# Patient Record
Sex: Female | Born: 1990 | Race: White | Hispanic: No | Marital: Married | Smoking: Never smoker
Health system: Southern US, Community
[De-identification: ages and names within clinical notes are randomized; demographics above are authoritative.]

## PROBLEM LIST (undated history)

## (undated) ENCOUNTER — Inpatient Hospital Stay (HOSPITAL_COMMUNITY): Payer: Self-pay

## (undated) ENCOUNTER — Ambulatory Visit: Source: Home / Self Care

## (undated) DIAGNOSIS — A4902 Methicillin resistant Staphylococcus aureus infection, unspecified site: Secondary | ICD-10-CM

## (undated) DIAGNOSIS — K8020 Calculus of gallbladder without cholecystitis without obstruction: Secondary | ICD-10-CM

## (undated) DIAGNOSIS — R51 Headache: Secondary | ICD-10-CM

## (undated) DIAGNOSIS — O2301 Infections of kidney in pregnancy, first trimester: Secondary | ICD-10-CM

## (undated) DIAGNOSIS — B999 Unspecified infectious disease: Secondary | ICD-10-CM

## (undated) HISTORY — DX: Infections of kidney in pregnancy, first trimester: O23.01

## (undated) HISTORY — DX: Headache: R51

## (undated) HISTORY — PX: NO PAST SURGERIES: SHX2092

---

## 2011-03-22 ENCOUNTER — Encounter: Payer: Self-pay | Admitting: *Deleted

## 2011-03-22 ENCOUNTER — Emergency Department (HOSPITAL_COMMUNITY)
Admission: EM | Admit: 2011-03-22 | Discharge: 2011-03-22 | Disposition: A | Discharge status: Home / Self Care | Payer: Self-pay | Attending: Emergency Medicine | Admitting: Emergency Medicine

## 2011-03-22 DIAGNOSIS — J3489 Other specified disorders of nose and nasal sinuses: Secondary | ICD-10-CM | POA: Insufficient documentation

## 2011-03-22 DIAGNOSIS — Z09 Encounter for follow-up examination after completed treatment for conditions other than malignant neoplasm: Secondary | ICD-10-CM | POA: Insufficient documentation

## 2011-03-22 DIAGNOSIS — R221 Localized swelling, mass and lump, neck: Secondary | ICD-10-CM | POA: Insufficient documentation

## 2011-03-22 DIAGNOSIS — R220 Localized swelling, mass and lump, head: Secondary | ICD-10-CM | POA: Insufficient documentation

## 2011-03-22 MED ORDER — TETANUS-DIPHTH-ACELL PERTUSSIS 5-2.5-18.5 LF-MCG/0.5 IM SUSP
0.5000 mL | Freq: Once | INTRAMUSCULAR | Status: AC
  Administered 2011-03-22: 0.5 mL via INTRAMUSCULAR
  Filled 2011-03-22: qty 0.5

## 2011-03-22 NOTE — ED Notes (Signed)
No reaction to medication noted. 

## 2011-03-22 NOTE — ED Notes (Signed)
Pt has abcess to right nostril. Went to [facility] yesterday, started on antibiotics and pain meds with no relief yet.

## 2011-03-22 NOTE — ED Provider Notes (Signed)
History     CSN: [id number] Arrival date & time: 03/22/2011  4:25 PM   First MD Initiated Contact with Patient 03/22/11 1936    Chief complaint nasal pain  Chief Complaint  Patient presents with  . Wound Check    (Consider location/radiation/quality/duration/timing/severity/associated sxs/prior treatment) HPI Patient complains of painful swollen right side of her nose onset 4 days ago. Patient saw hospital in [place] yesterday prescribed Bactrim DS Bactroban ointment and Vicodin which she's taken without relief. He seems soft slight green drainage from the right nares today no other complaint no fever. Pain nonradiating sit moderate to severe worse with palpation. No other associated symptoms pain not improved by anything History reviewed. No pertinent past medical history.  History reviewed. No pertinent past surgical history.  History reviewed. No pertinent family history.  History  Substance Use Topics  . Smoking status: Never Smoker   . Smokeless tobacco: Not on file  . Alcohol Use: No    OB History    Grav Para Term Preterm Abortions TAB SAB Ect Mult Living                  Review of Systems  Constitutional: Negative.   HENT:       Nasal swell  Eyes: Negative.   Respiratory: Negative.   Skin:       Redness swelling of right nose  Neurological: Negative.   Hematological: Negative.   Psychiatric/Behavioral: Negative.     Allergies  Review of patient's allergies indicates no known allergies.  Home Medications   Current Outpatient Rx  Name Route Sig Dispense Refill  . HYDROCODONE-ACETAMINOPHEN 5-500 MG PO TABS Oral Take 1 tablet by mouth every 4 (four) hours as needed. For pain     . MUPIROCIN 2 % EX OINT Topical Apply 1 application topically 3 (three) times daily.      . SULFAMETHOXAZOLE-TMP DS 800-160 MG PO TABS Oral Take 1 tablet by mouth 2 (two) times daily.        BP 126/89  Pulse 80  Temp(Src) 98.6 F (37 C) (Oral)  Resp 16  SpO2 100%  LMP  03/10/2011  Physical Exam  Nursing note and vitals reviewed. Constitutional: She appears well-developed and well-nourished. No distress.  HENT:  Head: Normocephalic and atraumatic.  Right Ear: External ear normal.  Left Ear: External ear normal.  Mouth/Throat: Oropharynx is clear and moist.       Right nose reddened minimally tender nonfluctuant. No masses or abscess identified in nares  Eyes: Conjunctivae are normal. Pupils are equal, round, and reactive to light.       No pain on extraocular movement  Neck: Neck supple. No tracheal deviation present. No thyromegaly present.  Cardiovascular: Normal rate.   No murmur heard. Pulmonary/Chest: Effort normal.  Abdominal: Soft. Bowel sounds are normal. She exhibits no distension. There is no tenderness.  Musculoskeletal: Normal range of motion. She exhibits no edema and no tenderness.  Neurological: She is alert. Coordination normal.  Skin: Skin is warm and dry. No rash noted.  Psychiatric: She has a normal mood and affect.    ED Course  Procedures (including critical care time)  Labs Reviewed - No data to display No results found.   No diagnosis found.    MDM  No definite abscess seen Case discussed with Dr. Pollyann Kennedy And continue present medications. Dr. Pollyann Kennedy will see patient in office tomorrow Diagnosis pain in no Differential diagnosis cellulitis versus early abscess  Doug Sou, MD 03/22/11 2010

## 2011-11-06 ENCOUNTER — Encounter (HOSPITAL_COMMUNITY): Payer: Self-pay | Admitting: Emergency Medicine

## 2011-11-06 ENCOUNTER — Emergency Department (HOSPITAL_COMMUNITY)
Admission: EM | Admit: 2011-11-06 | Discharge: 2011-11-06 | Disposition: A | Discharge status: Home / Self Care | Payer: Self-pay | Attending: Emergency Medicine | Admitting: Emergency Medicine

## 2011-11-06 DIAGNOSIS — R6883 Chills (without fever): Secondary | ICD-10-CM | POA: Insufficient documentation

## 2011-11-06 DIAGNOSIS — R51 Headache: Secondary | ICD-10-CM | POA: Insufficient documentation

## 2011-11-06 DIAGNOSIS — R112 Nausea with vomiting, unspecified: Secondary | ICD-10-CM | POA: Insufficient documentation

## 2011-11-06 DIAGNOSIS — N390 Urinary tract infection, site not specified: Secondary | ICD-10-CM | POA: Insufficient documentation

## 2011-11-06 LAB — URINALYSIS, ROUTINE W REFLEX MICROSCOPIC
Bilirubin Urine: NEGATIVE
Glucose, UA: NEGATIVE mg/dL
Ketones, ur: NEGATIVE mg/dL
Protein, ur: 30 mg/dL — AB
pH: 6 (ref 5.0–8.0)

## 2011-11-06 LAB — URINE MICROSCOPIC-ADD ON

## 2011-11-06 LAB — CBC WITH DIFFERENTIAL/PLATELET
Eosinophils Absolute: 0.1 10*3/uL (ref 0.0–0.7)
HCT: 39.4 % (ref 36.0–46.0)
Hemoglobin: 13.1 g/dL (ref 12.0–15.0)
Lymphs Abs: 1.1 10*3/uL (ref 0.7–4.0)
MCH: 28.6 pg (ref 26.0–34.0)
MCHC: 33.2 g/dL (ref 30.0–36.0)
MCV: 86 fL (ref 78.0–100.0)
Monocytes Absolute: 0.2 10*3/uL (ref 0.1–1.0)
Monocytes Relative: 2 % — ABNORMAL LOW (ref 3–12)
Neutrophils Relative %: 88 % — ABNORMAL HIGH (ref 43–77)
RBC: 4.58 MIL/uL (ref 3.87–5.11)

## 2011-11-06 LAB — POCT I-STAT, CHEM 8
BUN: 12 mg/dL (ref 6–23)
Hemoglobin: 13.6 g/dL (ref 12.0–15.0)
Potassium: 3.5 mEq/L (ref 3.5–5.1)
Sodium: 142 mEq/L (ref 135–145)
TCO2: 23 mmol/L (ref 0–100)

## 2011-11-06 MED ORDER — ONDANSETRON HCL 4 MG/2ML IJ SOLN
4.0000 mg | Freq: Once | INTRAMUSCULAR | Status: AC
  Administered 2011-11-06: 4 mg via INTRAVENOUS
  Filled 2011-11-06: qty 2

## 2011-11-06 MED ORDER — SODIUM CHLORIDE 0.9 % IV BOLUS (SEPSIS)
1000.0000 mL | Freq: Once | INTRAVENOUS | Status: AC
  Administered 2011-11-06: 1000 mL via INTRAVENOUS

## 2011-11-06 MED ORDER — CEPHALEXIN 500 MG PO CAPS: 500.0000 mg | ORAL_CAPSULE | Freq: Four times a day (QID) | ORAL | Status: AC

## 2011-11-06 MED ORDER — CEPHALEXIN 250 MG PO CAPS
500.0000 mg | ORAL_CAPSULE | Freq: Once | ORAL | Status: AC
  Administered 2011-11-06: 500 mg via ORAL
  Filled 2011-11-06: qty 2

## 2011-11-06 MED ORDER — PROMETHAZINE HCL 25 MG PO TABS: 25.0000 mg | ORAL_TABLET | Freq: Four times a day (QID) | ORAL | Status: DC | PRN

## 2011-11-06 NOTE — ED Provider Notes (Signed)
Medical screening examination/treatment/procedure(s) were performed by non-physician practitioner and as supervising physician I was immediately available for consultation/collaboration.   Coreen Shippee, MD 11/06/11 0706 

## 2011-11-06 NOTE — ED Provider Notes (Signed)
History     CSN: [id number]  Arrival date & time 11/06/11  6578   First MD Initiated Contact with Patient 11/06/11 [phone]      Chief Complaint  Patient presents with  . Chills  . Headache    (Consider location/radiation/quality/duration/timing/severity/associated sxs/prior treatment) HPI Comments: Patient here after having awakened twice over the past 2 nights with shaking spells where she had to pile blankets on herself - states she did not feel cold, then she states she developed a vague frontal headache with several episodes of nausea and vomiting - states that she did the same thing last night but it resolved in about 2 hours - states felt fine through the day today - denies fever, chills, neck pain or stiffness, chest pain, shortness of breath, cough, congestion, abdominal pain, possibility of pregnancy, dysuria, hematuria, is currently on menses.  Patient is a 21 y.o. female presenting with headaches. The history is provided by the patient. No language interpreter was used.  Headache  This is a new problem. The current episode started yesterday. The problem occurs constantly. The problem has not changed since onset.The headache is associated with nothing. The pain is located in the frontal region. The quality of the pain is described as throbbing. The pain is at a severity of 6/10. The pain is moderate. The pain does not radiate. Associated symptoms include nausea and vomiting. Pertinent negatives include no anorexia, no fever, no malaise/fatigue, no chest pressure, no near-syncope, no orthopnea, no palpitations, no syncope and no shortness of breath. She has tried nothing for the symptoms. The treatment provided no relief.    History reviewed. No pertinent past medical history.  History reviewed. No pertinent past surgical history.  History reviewed. No pertinent family history.  History  Substance Use Topics  . Smoking status: Never Smoker   . Smokeless tobacco: Not on file  .  Alcohol Use: No    OB History    Grav Para Term Preterm Abortions TAB SAB Ect Mult Living                  Review of Systems  Constitutional: Negative for fever, chills and malaise/fatigue.  HENT: Negative for neck pain and neck stiffness.   Eyes: Negative for pain.  Respiratory: Negative for shortness of breath.   Cardiovascular: Negative for palpitations, orthopnea, syncope and near-syncope.  Gastrointestinal: Positive for nausea and vomiting. Negative for abdominal pain and anorexia.  Genitourinary: Negative for dysuria and flank pain.  Musculoskeletal: Negative for back pain.  Skin: Negative for rash.  Neurological: Positive for headaches.  All other systems reviewed and are negative.    Allergies  Review of patient's allergies indicates no known allergies.  Home Medications  No current outpatient prescriptions on file.  BP 101/69  Pulse 127  Temp 99.8 F (37.7 C) (Oral)  Resp 16  SpO2 98%  LMP 11/01/2011  Physical Exam  Nursing note and vitals reviewed. Constitutional: She is oriented to person, place, and time. She appears well-developed and well-nourished. No distress.  HENT:  Head: Normocephalic and atraumatic.  Right Ear: External ear normal.  Left Ear: External ear normal.  Nose: Nose normal.  Mouth/Throat: Oropharynx is clear and moist. No oropharyngeal exudate.  Eyes: Conjunctivae are normal. Pupils are equal, round, and reactive to light. No scleral icterus.  Neck: Normal range of motion. Neck supple. No Brudzinski's sign and no Kernig's sign noted.  Cardiovascular: Regular rhythm and normal heart sounds.  Exam reveals no gallop and no friction  rub.   No murmur heard.      tachycardia  Pulmonary/Chest: Effort normal and breath sounds normal. No respiratory distress. She has no wheezes. She has no rales. She exhibits no tenderness.  Abdominal: Soft. Bowel sounds are normal. She exhibits no distension and no mass. There is no tenderness. There is no  rebound and no guarding.  Musculoskeletal: Normal range of motion. She exhibits no edema and no tenderness.  Lymphadenopathy:    She has no cervical adenopathy.  Neurological: She is alert and oriented to person, place, and time. No cranial nerve deficit. She exhibits normal muscle tone. Coordination normal.  Skin: Skin is warm and dry. No rash noted. No erythema. No pallor.  Psychiatric: She has a normal mood and affect. Her behavior is normal. Judgment and thought content normal.    ED Course  Procedures (including critical care time)   Labs Reviewed  POCT PREGNANCY, URINE  URINALYSIS, ROUTINE W REFLEX MICROSCOPIC  CBC WITH DIFFERENTIAL  MONONUCLEOSIS SCREEN   No results found.  Results for orders placed during the hospital encounter of 11/06/11  URINALYSIS, ROUTINE W REFLEX MICROSCOPIC      Component Value Range   Color, Urine YELLOW  YELLOW   APPearance TURBID (*) CLEAR   Specific Gravity, Urine 1.017  1.005 - 1.030   pH 6.0  5.0 - 8.0   Glucose, UA NEGATIVE  NEGATIVE mg/dL   Hgb urine dipstick TRACE (*) NEGATIVE   Bilirubin Urine NEGATIVE  NEGATIVE   Ketones, ur NEGATIVE  NEGATIVE mg/dL   Protein, ur 30 (*) NEGATIVE mg/dL   Urobilinogen, UA 0.2  0.0 - 1.0 mg/dL   Nitrite POSITIVE (*) NEGATIVE   Leukocytes, UA LARGE (*) NEGATIVE  POCT PREGNANCY, URINE      Component Value Range   Preg Test, Ur NEGATIVE  NEGATIVE  URINE MICROSCOPIC-ADD ON      Component Value Range   Squamous Epithelial / LPF RARE  RARE   WBC, UA TOO NUMEROUS TO COUNT  <3 WBC/hpf   RBC / HPF 0-2  <3 RBC/hpf   Bacteria, UA MANY (*) RARE  POCT I-STAT, CHEM 8      Component Value Range   Sodium 142  135 - 145 mEq/L   Potassium 3.5  3.5 - 5.1 mEq/L   Chloride 105  96 - 112 mEq/L   BUN 12  6 - 23 mg/dL   Creatinine, Ser 4.09  0.50 - 1.10 mg/dL   Glucose, Bld 98  70 - 99 mg/dL   Calcium, Ion 8.11  9.14 - 1.32 mmol/L   TCO2 23  0 - 100 mmol/L   Hemoglobin 13.6  12.0 - 15.0 g/dL   HCT 78.2  95.6 -  21.3 %   No results found.   UTI    MDM  Patient here with 2 day history of chills, headache and vomiting but only at night - urine shows large leukocytes, positive nitrite with TNTC WBC's - this is the likely the cause of her symptoms - upon discussion with the patient she now states that she was having dysuria several days ago.        Izola Price Baker, [place] 11/06/11 [phone]

## 2011-11-06 NOTE — ED Notes (Signed)
Pt states that last night and tonight she started having chills. Pt states she couldn't stop shaking. Pt states that she vomited once and then started having generalized pain and decided to come in. Pt denies abdominal pain. Pt states slight HA. Pt vomited once in room.

## 2011-11-06 NOTE — Discharge Instructions (Signed)

## 2011-11-06 NOTE — ED Notes (Signed)
Patient complaining of chills, headache, nausea, and vomiting that started this morning.  Denies diarrhea.  Denies pain at this time.

## 2011-11-08 LAB — URINE CULTURE: Special Requests: NORMAL

## 2011-11-09 NOTE — ED Notes (Signed)
+   urine Patient treated with keflex-sensitive to same-chart appended per protocol MD. 

## 2012-05-08 NOTE — L&D Delivery Note (Signed)
Delivery Note At 7:02 PM a viable female was delivered via Vaginal, Spontaneous Delivery (Presentation: Middle Occiput Anterior).  APGAR: 9, 9; weight: pending Placenta status: Intact, Spontaneous.  Cord: 3 vessels with the following complications: None.   Anesthesia: Epidural  Episiotomy: None Lacerations: 1st degree;Perineal Suture Repair: 3.0 vicryl Est. Blood Loss (mL): 200  Mother pushed well for 30 mins to deliver viable female infant in direct OA position. No nuchal cord. Shoulders and body delivered without difficulty or delay. Baby vigorous and crying upon delivery and placed on maternal abdomen. Cord clamping delayed until umbilical cord stopped pulsing. Cord then clamped and cut by FOB.  Third stage of labor actively managed with gentle cord traction and Oxytocin. Placenta delivered intact with 3 vessel cord. Attention was then turned to the perineum which sustained a first degree laceration. The laceration was hemostatic however 1 stitch was placed to reapproximate the posterior fourchette. Pt tolerated the procedure well. Mother and infant stable at conclusion of delivery. Counts correct and verified. Mom to postpartum. Infant to couplet care/skin to skin.   Delivery performed by me with direct supervision by Caren Griffins, CNM  Cowart, Ryann 03/14/2013, 7:56 PM Evaluation and management procedures were performed by Resident physician under my supervision/collaboration. Chart reviewed, patient examined by me and I agree with management and plan.

## 2012-06-24 ENCOUNTER — Encounter (HOSPITAL_COMMUNITY): Payer: Self-pay | Admitting: *Deleted

## 2012-06-24 ENCOUNTER — Emergency Department (HOSPITAL_COMMUNITY)
Admission: EM | Admit: 2012-06-24 | Discharge: 2012-06-24 | Disposition: A | Discharge status: Home / Self Care | Payer: Self-pay | Attending: Emergency Medicine | Admitting: Emergency Medicine

## 2012-06-24 DIAGNOSIS — J3489 Other specified disorders of nose and nasal sinuses: Secondary | ICD-10-CM | POA: Insufficient documentation

## 2012-06-24 DIAGNOSIS — IMO0001 Reserved for inherently not codable concepts without codable children: Secondary | ICD-10-CM | POA: Insufficient documentation

## 2012-06-24 DIAGNOSIS — R05 Cough: Secondary | ICD-10-CM | POA: Insufficient documentation

## 2012-06-24 DIAGNOSIS — R059 Cough, unspecified: Secondary | ICD-10-CM | POA: Insufficient documentation

## 2012-06-24 DIAGNOSIS — R6889 Other general symptoms and signs: Secondary | ICD-10-CM

## 2012-06-24 DIAGNOSIS — R1110 Vomiting, unspecified: Secondary | ICD-10-CM | POA: Insufficient documentation

## 2012-06-24 DIAGNOSIS — R509 Fever, unspecified: Secondary | ICD-10-CM | POA: Insufficient documentation

## 2012-06-24 DIAGNOSIS — J029 Acute pharyngitis, unspecified: Secondary | ICD-10-CM | POA: Insufficient documentation

## 2012-06-24 MED ORDER — HYDROCODONE-HOMATROPINE 5-1.5 MG/5ML PO SYRP
5.0000 mL | ORAL_SOLUTION | Freq: Four times a day (QID) | ORAL | Status: DC | PRN
Start: 2012-06-24 — End: 2013-01-08

## 2012-06-24 MED ORDER — HYDROCOD POLST-CHLORPHEN POLST 10-8 MG/5ML PO LQCR
5.0000 mL | Freq: Once | ORAL | Status: AC
  Administered 2012-06-24: 5 mL via ORAL
  Filled 2012-06-24: qty 5

## 2012-06-24 NOTE — ED Notes (Signed)
Pt c/o cough beginning on Friday, fever starting Sat. Pt c/o vomiting x 1 Sat.

## 2012-06-24 NOTE — ED Provider Notes (Signed)
History     CSN: [id number]  Arrival date & time 06/24/12  0058   First MD Initiated Contact with Patient 06/24/12 0140      Chief Complaint  Patient presents with  . Fever    (Consider location/radiation/quality/duration/timing/severity/associated sxs/prior treatment) Patient is a 22 y.o. female presenting with fever. The history is provided by the patient.  Fever Max temp prior to arrival:  101 Temp source:  Oral Severity:  Mild Onset quality:  Gradual Duration:  4 days Timing:  Intermittent Chronicity:  New Relieved by: tylenol cold. Associated symptoms: congestion, cough, myalgias, rhinorrhea, sore throat and vomiting (post tussive x 1)   Associated symptoms: no chest pain, no chills, no confusion, no dysuria and no headaches   Risk factors: no hx of cancer, no immunosuppression, no recent travel and no sick contacts     History reviewed. No pertinent past medical history.  History reviewed. No pertinent past surgical history.  History reviewed. No pertinent family history.  History  Substance Use Topics  . Smoking status: Never Smoker   . Smokeless tobacco: Not on file  . Alcohol Use: No    OB History   Grav Para Term Preterm Abortions TAB SAB Ect Mult Living                  Review of Systems  Constitutional: Positive for fever. Negative for chills and appetite change.  HENT: Positive for congestion, sore throat and rhinorrhea.   Eyes: Negative for visual disturbance.  Respiratory: Positive for cough. Negative for shortness of breath.   Cardiovascular: Negative for chest pain and leg swelling.  Gastrointestinal: Positive for vomiting (post tussive x 1). Negative for abdominal pain.  Genitourinary: Negative for dysuria, urgency and frequency.  Musculoskeletal: Positive for myalgias.  Neurological: Negative for dizziness, syncope, weakness, light-headedness, numbness and headaches.  Psychiatric/Behavioral: Negative for confusion.  All other systems  reviewed and are negative.    Allergies  Review of patient's allergies indicates no known allergies.  Home Medications   Current Outpatient Rx  Name  Route  Sig  Dispense  Refill  . EXPIRED: promethazine (PHENERGAN) 25 MG tablet   Oral   Take 1 tablet (25 mg total) by mouth every 6 (six) hours as needed for nausea.   30 tablet   0     BP 120/80  Pulse 124  Temp(Src) 99.6 F (37.6 C) (Oral)  Resp 18  Ht 5\' 3"  (1.6 m)  Wt 201 lb (91.173 kg)  BMI 35.61 kg/m2  SpO2 97%  LMP 06/04/2012  Physical Exam  Nursing note and vitals reviewed. Constitutional: She is oriented to person, place, and time. She appears well-developed and well-nourished. No distress.  HENT:  Head: Normocephalic and atraumatic.  Nasal congestion & rhinorrhea. Normal L & R external ear canals and TMs. No tragal or mastoid tenderness. Oropharynx moist, without tonsillar exudate.   Eyes:  No pain w EOM. Conjunctiva normal   Neck: Normal range of motion.  Soft, no nuchal rigidity. No lymphadenopathy  Cardiovascular:  RRR, no aberrancy on auscultation  Pulmonary/Chest: Effort normal.  LCAB, no respiratory distress  Musculoskeletal: Normal range of motion.  Neurological: She is alert and oriented to person, place, and time.  Skin: Skin is warm and dry. She is not diaphoretic.  No rash  Psychiatric: She has a normal mood and affect. Her behavior is normal.    ED Course  Procedures (including critical care time)  Labs Reviewed  URINALYSIS, ROUTINE W REFLEX MICROSCOPIC  No results found.   No diagnosis found.    MDM  Flu like s/s Patient with symptoms consistent with influenza.  Vitals are stable, low-grade fever.  No signs of dehydration, tolerating PO's.  Lungs are clear. Due to patient's presentation and physical exam a chest x-ray was not ordered bc likely diagnosis of flu.  Discussed the cost versus benefit of Tamiflu treatment with the patient.  The patient understands that symptoms are  greater than the recommended 24-48 hour window of treatment.  Patient will be discharged with instructions to orally hydrate, rest, and use over-the-counter medications such as anti-inflammatories ibuprofen and Aleve for muscle aches and Tylenol for fever.  Patient will also be given a cough suppressant.          Jaci Carrel, [place] 06/24/12 0206

## 2012-06-24 NOTE — ED Provider Notes (Signed)
Medical screening examination/treatment/procedure(s) were performed by non-physician practitioner and as supervising physician I was immediately available for consultation/collaboration.   Lyanne Co, MD 06/24/12 [phone]

## 2012-06-24 NOTE — ED Notes (Signed)
Pt verbalized understanding not to drive or work while taking narcotic cough medication. Ambulatory to d/c window.

## 2012-07-27 ENCOUNTER — Encounter (HOSPITAL_COMMUNITY): Payer: Self-pay | Admitting: Emergency Medicine

## 2012-07-27 ENCOUNTER — Emergency Department (HOSPITAL_COMMUNITY)
Admission: EM | Admit: 2012-07-27 | Discharge: 2012-07-27 | Disposition: A | Discharge status: Home / Self Care | Payer: Medicaid Other | Attending: Emergency Medicine | Admitting: Emergency Medicine

## 2012-07-27 DIAGNOSIS — N6459 Other signs and symptoms in breast: Secondary | ICD-10-CM | POA: Insufficient documentation

## 2012-07-27 DIAGNOSIS — R109 Unspecified abdominal pain: Secondary | ICD-10-CM | POA: Insufficient documentation

## 2012-07-27 DIAGNOSIS — Z3490 Encounter for supervision of normal pregnancy, unspecified, unspecified trimester: Secondary | ICD-10-CM

## 2012-07-27 DIAGNOSIS — O219 Vomiting of pregnancy, unspecified: Secondary | ICD-10-CM | POA: Insufficient documentation

## 2012-07-27 DIAGNOSIS — O9989 Other specified diseases and conditions complicating pregnancy, childbirth and the puerperium: Secondary | ICD-10-CM | POA: Insufficient documentation

## 2012-07-27 LAB — URINALYSIS, ROUTINE W REFLEX MICROSCOPIC
Glucose, UA: NEGATIVE mg/dL
Hgb urine dipstick: NEGATIVE
Specific Gravity, Urine: 1.009 (ref 1.005–1.030)

## 2012-07-27 LAB — PREGNANCY, URINE: Preg Test, Ur: POSITIVE — AB

## 2012-07-27 LAB — POCT PREGNANCY, URINE: Preg Test, Ur: POSITIVE — AB

## 2012-07-27 NOTE — ED Notes (Signed)
Pt has taken 3 pregnancy tests at home with pos results. Pt wants confirmation she is pregnant.

## 2012-07-27 NOTE — ED Provider Notes (Signed)
Medical screening examination/treatment/procedure(s) were performed by non-physician practitioner and as supervising physician I was immediately available for consultation/collaboration.   Jaylon Grode B. Kean Gautreau, MD 07/27/12 1550 

## 2012-07-27 NOTE — ED Provider Notes (Signed)
History     CSN: [id number]  Arrival date & time 07/27/12  0021   First MD Initiated Contact with Patient 07/27/12 0113      Chief Complaint  Patient presents with  . Possible Pregnancy    (Consider location/radiation/quality/duration/timing/severity/associated sxs/prior treatment) HPI  12:36 AM 22 year old G0 P0 female presents requesting for pregnancy test. Patient states she has been trying to get pregnant with her husband and recently has had positive pregnancy tests. She has had 3 separate positive pregnancy test, but would like to "make sure i'm pregnant".. Her last menstrual period was January 28. She denies any other significant complaint except some sensation of breast heaviness, tenderness at the nipples, and mild low abdominal cramping with nausea and vomiting. She denies fever, chills, chest pain, shortness of breath, dysuria, hematuria, or rash.  History reviewed. No pertinent past medical history.  History reviewed. No pertinent past surgical history.  No family history on file.  History  Substance Use Topics  . Smoking status: Never Smoker   . Smokeless tobacco: Not on file  . Alcohol Use: No    OB History   Grav Para Term Preterm Abortions TAB SAB Ect Mult Living                  Review of Systems  Constitutional:       10 Systems reviewed and all are negative for acute change except as noted in the HPI.     Allergies  Review of patient's allergies indicates no known allergies.  Home Medications   Current Outpatient Rx  Name  Route  Sig  Dispense  Refill  . HYDROcodone-homatropine (HYCODAN) 5-1.5 MG/5ML syrup   Oral   Take 5 mLs by mouth every 6 (six) hours as needed for cough.   120 mL   0   . ibuprofen (ADVIL,MOTRIN) 200 MG tablet   Oral   Take 400 mg by mouth every 6 (six) hours as needed for pain or fever.           BP 117/55  Pulse 76  Temp(Src) 98.2 F (36.8 C) (Oral)  Resp 18  SpO2 100%  LMP 06/04/2012  Physical Exam   Nursing note and vitals reviewed. Constitutional: She appears well-developed and well-nourished. No distress.  Awake, alert, nontoxic appearance  HENT:  Head: Atraumatic.  Eyes: Conjunctivae are normal. Right eye exhibits no discharge. Left eye exhibits no discharge.  Neck: Neck supple.  Cardiovascular: Normal rate and regular rhythm.   Pulmonary/Chest: Effort normal. No respiratory distress. She exhibits no tenderness.  Abdominal: Soft. There is no tenderness. There is no rebound.  Musculoskeletal: She exhibits no tenderness.  ROM appears intact, no obvious focal weakness  Neurological: She is alert.  Skin: No rash noted.  Psychiatric: She has a normal mood and affect.    ED Course  Procedures (including critical care time)  1:37 AM Pt is pregnant.  Will give referral to OBGYN for further care.  Encourage prenatal vitamin.    Labs Reviewed  POCT PREGNANCY, URINE - Abnormal; Notable for the following:    Preg Test, Ur POSITIVE (*)    All other components within normal limits  URINALYSIS, ROUTINE W REFLEX MICROSCOPIC  PREGNANCY, URINE   No results found.   1. Pregnant       MDM  BP 117/55  Pulse 76  Temp(Src) 98.2 F (36.8 C) (Oral)  Resp 18  SpO2 100%  LMP 06/04/2012         Fayrene Helper,  PA-C 07/27/12 0139

## 2012-09-06 LAB — OB RESULTS CONSOLE HIV ANTIBODY (ROUTINE TESTING): HIV: NONREACTIVE

## 2012-09-06 LAB — OB RESULTS CONSOLE ABO/RH: RH Type: POSITIVE

## 2012-09-06 LAB — OB RESULTS CONSOLE ANTIBODY SCREEN: Antibody Screen: NEGATIVE

## 2012-09-06 LAB — OB RESULTS CONSOLE GC/CHLAMYDIA: Gonorrhea: NEGATIVE

## 2012-09-06 LAB — OB RESULTS CONSOLE TSH: TSH: 3.289

## 2012-09-06 LAB — OB RESULTS CONSOLE RUBELLA ANTIBODY, IGM: Rubella: NON-IMMUNE/NOT IMMUNE

## 2012-10-02 LAB — CYTOLOGY - PAP: Pap Smear: NORMAL

## 2013-01-08 ENCOUNTER — Encounter (HOSPITAL_COMMUNITY): Payer: Self-pay | Admitting: Emergency Medicine

## 2013-01-08 ENCOUNTER — Encounter: Payer: Self-pay | Admitting: *Deleted

## 2013-01-08 ENCOUNTER — Emergency Department (HOSPITAL_COMMUNITY)
Admission: EM | Admit: 2013-01-08 | Discharge: 2013-01-08 | Disposition: A | Discharge status: Home / Self Care | Payer: Medicaid Other | Attending: Emergency Medicine | Admitting: Emergency Medicine

## 2013-01-08 DIAGNOSIS — Z8659 Personal history of other mental and behavioral disorders: Secondary | ICD-10-CM | POA: Insufficient documentation

## 2013-01-08 DIAGNOSIS — Z79899 Other long term (current) drug therapy: Secondary | ICD-10-CM | POA: Insufficient documentation

## 2013-01-08 DIAGNOSIS — R1110 Vomiting, unspecified: Secondary | ICD-10-CM

## 2013-01-08 DIAGNOSIS — O210 Mild hyperemesis gravidarum: Secondary | ICD-10-CM | POA: Insufficient documentation

## 2013-01-08 LAB — URINALYSIS, ROUTINE W REFLEX MICROSCOPIC
Bilirubin Urine: NEGATIVE
Glucose, UA: NEGATIVE mg/dL
Hgb urine dipstick: NEGATIVE
Ketones, ur: 15 mg/dL — AB
Nitrite: NEGATIVE
Protein, ur: NEGATIVE mg/dL
Specific Gravity, Urine: 1.019 (ref 1.005–1.030)
Urobilinogen, UA: 1 mg/dL (ref 0.0–1.0)
pH: 6 (ref 5.0–8.0)

## 2013-01-08 LAB — BASIC METABOLIC PANEL
BUN: 7 mg/dL (ref 6–23)
CO2: 23 mEq/L (ref 19–32)
Calcium: 9 mg/dL (ref 8.4–10.5)
Chloride: 102 mEq/L (ref 96–112)
Creatinine, Ser: 0.49 mg/dL — ABNORMAL LOW (ref 0.50–1.10)
GFR calc Af Amer: >90 mL/min (ref >90)
GFR calc non Af Amer: >90 mL/min (ref >90)
Glucose, Bld: 88 mg/dL (ref 70–99)
Potassium: 3.8 mEq/L (ref 3.5–5.1)
Sodium: 134 mEq/L — ABNORMAL LOW (ref 135–145)

## 2013-01-08 LAB — CBC WITH DIFFERENTIAL/PLATELET
Basophils Absolute: 0 10*3/uL (ref 0.0–0.1)
Basophils Relative: 0 % (ref 0–1)
Eosinophils Absolute: 0.1 10*3/uL (ref 0.0–0.7)
Eosinophils Relative: 1 % (ref 0–5)
HCT: 32.2 % — ABNORMAL LOW (ref 36.0–46.0)
Hemoglobin: 10.9 g/dL — ABNORMAL LOW (ref 12.0–15.0)
Lymphocytes Relative: 16 % (ref 12–46)
Lymphs Abs: 1.2 10*3/uL (ref 0.7–4.0)
MCH: 29.1 pg (ref 26.0–34.0)
MCHC: 33.9 g/dL (ref 30.0–36.0)
MCV: 86.1 fL (ref 78.0–100.0)
Monocytes Absolute: 0.6 10*3/uL (ref 0.1–1.0)
Monocytes Relative: 8 % (ref 3–12)
Neutro Abs: 5.6 10*3/uL (ref 1.7–7.7)
Neutrophils Relative %: 76 % (ref 43–77)
Platelets: 318 10*3/uL (ref 150–400)
RBC: 3.74 MIL/uL — ABNORMAL LOW (ref 3.87–5.11)
RDW: 13.3 % (ref 11.5–15.5)
WBC: 7.3 10*3/uL (ref 4.0–10.5)

## 2013-01-08 LAB — URINE MICROSCOPIC-ADD ON

## 2013-01-08 MED ORDER — ONDANSETRON HCL 4 MG PO TABS: 4.0000 mg | ORAL_TABLET | Freq: Four times a day (QID) | ORAL | Status: DC

## 2013-01-08 MED ORDER — SODIUM CHLORIDE 0.9 % IV BOLUS (SEPSIS)
1000.0000 mL | Freq: Once | INTRAVENOUS | Status: AC
  Administered 2013-01-08: 1000 mL via INTRAVENOUS

## 2013-01-08 NOTE — Progress Notes (Signed)
At bedside, G1P0 at 31wk 1day. Pt reports n/v for more than 24 hours and unable to keep anything down. Pt states positive fetal movement, denies contractions, pain, leakage of fluid, or vaginal bleeding. Pt states she has received [facility] in [place] but has switched to [facility] and has first appointment on 9/15 in the clinic.

## 2013-01-08 NOTE — Progress Notes (Signed)
[facility] called regarding pt at 31 weeks with n/v. OB RR RN in route

## 2013-01-08 NOTE — ED Notes (Signed)
Pt c/o of vomiting since yesterday, unable to keep anything down. [redacted] weeks pregnant.

## 2013-01-08 NOTE — ED Notes (Signed)
As I write this, the [facility] nurse is evaluating her; and has her on the fetal monitor.  Pt. Tells me she bagan to have frequent episodes of n/v since yesterday.  She denies cough/fever/diarrhea and is in no distress.  She happily tells me she is [redacted] week gestation with E.D.C. Of Nov. 4, 2014.

## 2013-01-09 ENCOUNTER — Inpatient Hospital Stay (HOSPITAL_COMMUNITY)
Admission: AD | Admit: 2013-01-09 | Payer: Medicaid Other | Source: Ambulatory Visit | Admitting: Obstetrics & Gynecology

## 2013-01-14 NOTE — ED Provider Notes (Signed)
CSN: [id number]     Arrival date & time 01/08/13  1808 History   First MD Initiated Contact with Patient 01/08/13 1837     Chief Complaint  Patient presents with  . Emesis During Pregnancy   (Consider location/radiation/quality/duration/timing/severity/associated sxs/prior Treatment) HPI  22yF with n/v. Onset about a days ago. Multiple episodes. Denies any pain. ~31w pregnant. Some issues early in pregnancy with nausea/vomting, but none in several weeks. No bleeding/leakage of fluid. No fever or chills. No urinary complaints. First pregnancy. No major issues thus far. Has been getting prenatal care.   Past Medical History  Diagnosis Date  . Anxiety   . EXBMWUXL(244.0)    Past Surgical History  Procedure Laterality Date  . Tonsillectomy    . Appendectomy     Family History  Problem Relation Age of Onset  . Asthma Brother   . Restless legs syndrome Mother   . Restless legs syndrome Sister    History  Substance Use Topics  . Smoking status: Never Smoker   . Smokeless tobacco: Not on file  . Alcohol Use: No   OB History   Grav Para Term Preterm Abortions TAB SAB Ect Mult Living   1              Review of Systems  All systems reviewed and negative, other than as noted in HPI.   Allergies  Review of patient's allergies indicates no known allergies.  Home Medications   Current Outpatient Rx  Name  Route  Sig  Dispense  Refill  . Prenatal Vit-Fe Fumarate-FA (PRENATAL MULTIVITAMIN) TABS tablet   Oral   Take 1 tablet by mouth daily at 12 noon.         . ranitidine (ZANTAC) 150 MG tablet   Oral   Take 150 mg by mouth 2 (two) times daily.         . ondansetron (ZOFRAN) 4 MG tablet   Oral   Take 1 tablet (4 mg total) by mouth every 6 (six) hours.   12 tablet   0    BP 107/59  Pulse 97  Temp(Src) 99.3 F (37.4 C) (Oral)  Resp 18  Ht 5\' 3"  (1.6 m)  Wt 210 lb (95.255 kg)  BMI 37.21 kg/m2  SpO2 97%  LMP 06/04/2012 Physical Exam  Nursing note and vitals  reviewed. Constitutional: She appears well-developed and well-nourished. No distress.  HENT:  Head: Normocephalic and atraumatic.  Eyes: Conjunctivae are normal. Right eye exhibits no discharge. Left eye exhibits no discharge.  Neck: Neck supple.  Cardiovascular: Normal rate, regular rhythm and normal heart sounds.  Exam reveals no gallop and no friction rub.   No murmur heard. Pulmonary/Chest: Effort normal and breath sounds normal. No respiratory distress.  Abdominal: Soft. She exhibits no distension. There is no tenderness.  Gravid uterus palpable above umbilicus.   Musculoskeletal: She exhibits no edema and no tenderness.  Neurological: She is alert.  Skin: Skin is warm and dry.  Psychiatric: She has a normal mood and affect. Her behavior is normal. Thought content normal.    ED Course  Procedures (including critical care time) Labs Review Labs Reviewed  URINALYSIS, ROUTINE W REFLEX MICROSCOPIC - Abnormal; Notable for the following:    APPearance CLOUDY (*)    Ketones, ur 15 (*)    Leukocytes, UA TRACE (*)    All other components within normal limits  CBC WITH DIFFERENTIAL - Abnormal; Notable for the following:    RBC 3.74 (*)  Hemoglobin 10.9 (*)    HCT 32.2 (*)    All other components within normal limits  BASIC METABOLIC PANEL - Abnormal; Notable for the following:    Sodium 134 (*)    Creatinine, Ser 0.49 (*)    All other components within normal limits  URINE MICROSCOPIC-ADD ON - Abnormal; Notable for the following:    Squamous Epithelial / LPF MANY (*)    Bacteria, UA FEW (*)    All other components within normal limits   Imaging Review No results found.  MDM   1. Emesis    22yF with vomiting. No abdominal pain and nontender abdomen. Baby lokos good on monitor.  Labs reassuring. Few bacteria noted on UA but many squam cells. abx deferred. Continues symtpomatic tx. Return precautions discussed.     Raeford Razor, MD 01/14/13 [phone]

## 2013-01-20 ENCOUNTER — Ambulatory Visit (INDEPENDENT_AMBULATORY_CARE_PROVIDER_SITE_OTHER): Payer: Medicaid Other | Admitting: Obstetrics & Gynecology

## 2013-01-20 ENCOUNTER — Encounter: Payer: Self-pay | Admitting: Obstetrics & Gynecology

## 2013-01-20 VITALS — BP 125/86 | Temp 98.6°F | Wt 211.6 lb

## 2013-01-20 DIAGNOSIS — Z3403 Encounter for supervision of normal first pregnancy, third trimester: Secondary | ICD-10-CM

## 2013-01-20 DIAGNOSIS — O99340 Other mental disorders complicating pregnancy, unspecified trimester: Secondary | ICD-10-CM

## 2013-01-20 DIAGNOSIS — Z23 Encounter for immunization: Secondary | ICD-10-CM

## 2013-01-20 DIAGNOSIS — Z3400 Encounter for supervision of normal first pregnancy, unspecified trimester: Secondary | ICD-10-CM | POA: Insufficient documentation

## 2013-01-20 LAB — POCT URINALYSIS DIP (DEVICE)
Bilirubin Urine: NEGATIVE
Glucose, UA: NEGATIVE mg/dL
Nitrite: NEGATIVE

## 2013-01-20 MED ORDER — TETANUS-DIPHTH-ACELL PERTUSSIS 5-2.5-18.5 LF-MCG/0.5 IM SUSP
0.5000 mL | Freq: Once | INTRAMUSCULAR | Status: AC
  Administered 2013-01-20: 0.5 mL via INTRAMUSCULAR

## 2013-01-20 NOTE — Progress Notes (Signed)
Pulse- 91 New ob packet given Tdap vaccine consented and info given Weight gain of 11-20lb

## 2013-01-20 NOTE — Progress Notes (Signed)
Pt received prenatal care at Dr. Tawni Levy office.  Parental records are confusing to interpret.  Many pertinent negative listed.  Pt only complains of mild headaches occasionally.  Pt did refuse genetic testing.  Pt reports nml anatomy US.  I do not have rocrds of the [place].  Will ask Dr. Shawnie Pons to fax those over.

## 2013-01-20 NOTE — Patient Instructions (Signed)
Intrauterine Device Information  An intrauterine device (IUD) is inserted into your uterus and prevents pregnancy. There are 2 types of IUDs available:  · Copper IUD. This type of IUD is wrapped in copper wire and is placed inside the uterus. Copper makes the uterus and fallopian tubes produce a fluid that kills sperm. The copper IUD can stay in place for 10 years.  · Hormone IUD. This type of IUD contains the hormone progestin (synthetic progesterone). The hormone thickens the cervical mucus and prevents sperm from entering the uterus, and it also thins the uterine lining to prevent implantation of a fertilized egg. The hormone can weaken or kill the sperm that get into the uterus. The hormone IUD can stay in place for 5 years.  Your caregiver will make sure you are a good candidate for a contraceptive IUD. Discuss with your caregiver the possible side effects.  ADVANTAGES  · It is highly effective, reversible, long-acting, and low maintenance.  · There are no estrogen-related side effects.  · An IUD can be used when breastfeeding.  · It is not associated with weight gain.  · It works immediately after insertion.  · The copper IUD does not interfere with your female hormones.  · The progesterone IUD can make heavy menstrual periods lighter.  · The progesterone IUD can be used for 5 years.  · The copper IUD can be used for 10 years.  DISADVANTAGES  · The progesterone IUD can be associated with irregular bleeding patterns.  · The copper IUD can make your menstrual flow heavier and more painful.  · You may experience cramping and vaginal bleeding after insertion.  Document Released: 03/28/2004 Document Revised: 07/17/2011 Document Reviewed: 08/27/2010  [employer]® Patient Information ©2014 [employer].

## 2013-01-21 DIAGNOSIS — O99340 Other mental disorders complicating pregnancy, unspecified trimester: Secondary | ICD-10-CM

## 2013-01-22 ENCOUNTER — Encounter: Payer: Self-pay | Admitting: *Deleted

## 2013-01-27 ENCOUNTER — Encounter: Payer: Self-pay | Admitting: *Deleted

## 2013-01-27 DIAGNOSIS — O99340 Other mental disorders complicating pregnancy, unspecified trimester: Secondary | ICD-10-CM | POA: Insufficient documentation

## 2013-02-03 ENCOUNTER — Ambulatory Visit (INDEPENDENT_AMBULATORY_CARE_PROVIDER_SITE_OTHER): Payer: Medicaid Other | Admitting: Advanced Practice Midwife

## 2013-02-03 VITALS — BP 112/80 | Temp 99.1°F | Wt 216.9 lb

## 2013-02-03 DIAGNOSIS — O99340 Other mental disorders complicating pregnancy, unspecified trimester: Secondary | ICD-10-CM

## 2013-02-03 LAB — POCT URINALYSIS DIP (DEVICE)
Bilirubin Urine: NEGATIVE
Ketones, ur: NEGATIVE mg/dL
Protein, ur: NEGATIVE mg/dL

## 2013-02-03 NOTE — Progress Notes (Signed)
P = 94 

## 2013-02-03 NOTE — Progress Notes (Signed)
Doing well.  Good fetal movement, denies vaginal bleeding, LOF, regular contractions.  

## 2013-02-18 ENCOUNTER — Ambulatory Visit (INDEPENDENT_AMBULATORY_CARE_PROVIDER_SITE_OTHER): Payer: Medicaid Other | Admitting: Obstetrics & Gynecology

## 2013-02-18 ENCOUNTER — Encounter: Payer: Self-pay | Admitting: Obstetrics & Gynecology

## 2013-02-18 VITALS — BP 121/84 | Temp 98.6°F | Wt 217.5 lb

## 2013-02-18 DIAGNOSIS — O99340 Other mental disorders complicating pregnancy, unspecified trimester: Secondary | ICD-10-CM

## 2013-02-18 DIAGNOSIS — Z3403 Encounter for supervision of normal first pregnancy, third trimester: Secondary | ICD-10-CM

## 2013-02-18 LAB — OB RESULTS CONSOLE GC/CHLAMYDIA: Chlamydia: NEGATIVE

## 2013-02-18 NOTE — Progress Notes (Signed)
Routine visit. Good FM. Cervical cultures obtained. She declines a flu vaccine despite recommendations.

## 2013-02-18 NOTE — Progress Notes (Signed)
P=83 Pt reports some irregular cramping. Decline Flu vaccine.

## 2013-02-19 LAB — POCT URINALYSIS DIP (DEVICE)
Glucose, UA: NEGATIVE mg/dL
Hgb urine dipstick: NEGATIVE
Nitrite: NEGATIVE
Urobilinogen, UA: 0.2 mg/dL (ref 0.0–1.0)

## 2013-02-19 LAB — GC/CHLAMYDIA PROBE AMP: CT Probe RNA: NEGATIVE

## 2013-02-25 ENCOUNTER — Ambulatory Visit (INDEPENDENT_AMBULATORY_CARE_PROVIDER_SITE_OTHER): Payer: Medicaid Other | Admitting: Family Medicine

## 2013-02-25 ENCOUNTER — Encounter: Payer: Self-pay | Admitting: Family Medicine

## 2013-02-25 VITALS — BP 127/85 | Wt 219.0 lb

## 2013-02-25 DIAGNOSIS — O99340 Other mental disorders complicating pregnancy, unspecified trimester: Secondary | ICD-10-CM

## 2013-02-25 DIAGNOSIS — Z3403 Encounter for supervision of normal first pregnancy, third trimester: Secondary | ICD-10-CM

## 2013-02-25 LAB — POCT URINALYSIS DIP (DEVICE)
Bilirubin Urine: NEGATIVE
Nitrite: NEGATIVE
Protein, ur: NEGATIVE mg/dL
pH: 6.5 (ref 5.0–8.0)

## 2013-02-25 NOTE — Progress Notes (Signed)
  Subjective:    Madeline [name] is a 22 y.o. female being seen today for her obstetrical visit. She is at [redacted]w[redacted]d gestation. Patient reports no bleeding, no contractions, no cramping and no leaking. Fetal movement: normal.  Menstrual History: OB History   Grav Para Term Preterm Abortions TAB SAB Ect Mult Living   1                Patient's last menstrual period was 06/04/2012.    The following portions of the patient's history were reviewed and updated as appropriate: allergies, current medications, past family history, past medical history, past social history, past surgical history and problem list.  Review of Systems Pertinent items are noted in HPI.   Objective:    BP 127/85  Wt 99.338 kg (219 lb)  BMI 38.8 kg/m2  LMP 06/04/2012  Breastfeeding? Unknown FHT: 140 BPM  Uterine Size: 38 cm  Presentations: cephalic  Pelvic Exam:              Dilation: 1cm       Effacement: 25%             Station:  -3    Consistency: soft            Position: posterior     Assessment:   Madeline [name] is a 22 y.o. G1P0 at [redacted]w[redacted]d here for ROB visit.    Discussed with Patient:  - Plans to breast/bottle feed.  All questions answered. - Continue prenatal vitamins. - Reviewed fetal kick counts Pt to perform daily at a time when the baby is active, lie laterally with both hands on belly in quiet room and count all movements (hiccups, shoulder rolls, obvious kicks, etc); pt is to report to clinic MAU for less than 10 movements felt in a 2 hour time period-pt told as soon as she counts 10 movements the count is complete.  - Routine precautions discussed (depression, infection s/s).   Patient provided with all pertinent phone numbers for emergencies. - RTC for any VB, regular, painful cramps/ctxs occurring at a rate of >2/10 min, fever (100.5 or higher), n/v/d, any pain that is unresolving or worsening, LOF, decreased fetal movement, CP, SOB, edema -RTC in one week for next  visit.  Problems: Patient Active Problem List   Diagnosis Date Noted  . Supervision of low-risk first pregnancy 01/20/2013    To Do: 1. NTD  [ ]  Vaccines: Flu:  Tdap: Recd [ ]  BCM: condoms [ ]  Readiness: baby has a place to sleep, car seat, other baby necessities.  Edu: [x ] TL precautions; [ ]  BF class; [ ]  childbirth class; [ ]   BF counseling;

## 2013-02-25 NOTE — Progress Notes (Signed)
P = 88   Pt reports some sharp, shooting pain into vagina, pelvic pressure and back pain/pressure. Pt desires cx exam.

## 2013-03-05 ENCOUNTER — Ambulatory Visit (INDEPENDENT_AMBULATORY_CARE_PROVIDER_SITE_OTHER): Payer: Medicaid Other | Admitting: Family Medicine

## 2013-03-05 ENCOUNTER — Encounter: Payer: Self-pay | Admitting: Family Medicine

## 2013-03-05 VITALS — BP 123/86 | Temp 98.0°F | Wt 222.3 lb

## 2013-03-05 DIAGNOSIS — Z3403 Encounter for supervision of normal first pregnancy, third trimester: Secondary | ICD-10-CM

## 2013-03-05 DIAGNOSIS — Z3400 Encounter for supervision of normal first pregnancy, unspecified trimester: Secondary | ICD-10-CM

## 2013-03-05 LAB — POCT URINALYSIS DIP (DEVICE)
Hgb urine dipstick: NEGATIVE
Protein, ur: NEGATIVE mg/dL
Specific Gravity, Urine: 1.02 (ref 1.005–1.030)
Urobilinogen, UA: 0.2 mg/dL (ref 0.0–1.0)
pH: 7 (ref 5.0–8.0)

## 2013-03-05 NOTE — Progress Notes (Signed)
P=83,   C/o pelvic pressure and sharp pelvic pains. States had a lot of mucous discharge after last visit, and also a lot Sunday night after intercourse. Looks like "foam soap"

## 2013-03-05 NOTE — Patient Instructions (Signed)

## 2013-03-05 NOTE — Progress Notes (Signed)
Madeline [name] is a 22 y.o. G1P0 at [redacted]w[redacted]d by L=13 here for ROB visit. Negative (10/14 0000)  +FM, no lof, no vb, intermittent ctx  Stripped membranes today  1.5/30%/-3  Discussed with Patient:  - Plans to breast feed.  All questions answered. - Continue prenatal vitamins. - Reviewed fetal kick counts Pt to perform daily at a time when the baby is active, lie laterally with both hands on belly in quiet room and count all movements (hiccups, shoulder rolls, obvious kicks, etc); pt is to report to clinic MAU for less than 10 movements felt in a 2 hour time period-pt told as soon as she counts 10 movements the count is complete.  - Routine precautions discussed (depression, infection s/s).   Patient provided with all pertinent phone numbers for emergencies. - RTC for any VB, regular, painful cramps/ctxs occurring at a rate of >2/10 min, fever (100.5 or higher), n/v/d, any pain that is unresolving or worsening, LOF, decreased fetal movement, CP, SOB, edema -RTC in one week for next visit.  Problems: Patient Active Problem List   Diagnosis Date Noted  . Supervision of low-risk first pregnancy 01/20/2013    To Do: 1. Schedule induction next visit if not delviered  [ ]  Vaccines: [id number]  Tdap: recd [ ]  BCM: condoms [ ]  Readiness: baby has a place to sleep, car seat, other baby necessities.  Edu: [x ] TL precautions; [ ]  BF class; [ ]  childbirth class; [ ]   BF counseling;

## 2013-03-12 ENCOUNTER — Telehealth (HOSPITAL_COMMUNITY): Payer: Self-pay | Admitting: *Deleted

## 2013-03-12 ENCOUNTER — Ambulatory Visit (INDEPENDENT_AMBULATORY_CARE_PROVIDER_SITE_OTHER): Payer: Medicaid Other | Admitting: Advanced Practice Midwife

## 2013-03-12 VITALS — BP 120/86 | Temp 98.0°F | Wt 222.3 lb

## 2013-03-12 DIAGNOSIS — Z3480 Encounter for supervision of other normal pregnancy, unspecified trimester: Secondary | ICD-10-CM

## 2013-03-12 DIAGNOSIS — O480 Post-term pregnancy: Secondary | ICD-10-CM

## 2013-03-12 LAB — POCT URINALYSIS DIP (DEVICE)
Hgb urine dipstick: NEGATIVE
Nitrite: NEGATIVE
Protein, ur: NEGATIVE mg/dL
Urobilinogen, UA: 0.2 mg/dL (ref 0.0–1.0)
pH: 7 (ref 5.0–8.0)

## 2013-03-12 NOTE — Progress Notes (Signed)
Pulse- 70 Patient reports pelvic and vaginal pressure

## 2013-03-12 NOTE — Progress Notes (Signed)
Doing well.  Good fetal movement, denies vaginal bleeding, LOF, regular contractions. Membranes swept today.  IOL scheduled for Sunday.

## 2013-03-12 NOTE — Progress Notes (Signed)
IOL scheduled November 9th @ 0730

## 2013-03-12 NOTE — Telephone Encounter (Signed)
Preadmission screen  

## 2013-03-13 ENCOUNTER — Other Ambulatory Visit: Payer: Self-pay

## 2013-03-14 ENCOUNTER — Encounter (HOSPITAL_COMMUNITY): Payer: Self-pay | Admitting: *Deleted

## 2013-03-14 ENCOUNTER — Encounter (HOSPITAL_COMMUNITY): Payer: Medicaid Other | Admitting: Anesthesiology

## 2013-03-14 ENCOUNTER — Inpatient Hospital Stay (HOSPITAL_COMMUNITY)
Admission: AD | Admit: 2013-03-14 | Discharge: 2013-03-16 | DRG: 775 | Disposition: A | Discharge status: Home / Self Care | Payer: Medicaid Other | Source: Ambulatory Visit | Attending: Family Medicine | Admitting: Family Medicine

## 2013-03-14 ENCOUNTER — Inpatient Hospital Stay (HOSPITAL_COMMUNITY): Payer: Medicaid Other | Admitting: Anesthesiology

## 2013-03-14 DIAGNOSIS — IMO0001 Reserved for inherently not codable concepts without codable children: Secondary | ICD-10-CM

## 2013-03-14 HISTORY — DX: Unspecified infectious disease: B99.9

## 2013-03-14 HISTORY — DX: Methicillin resistant Staphylococcus aureus infection, unspecified site: A49.02

## 2013-03-14 LAB — TYPE AND SCREEN
ABO/RH(D): O POS
Antibody Screen: NEGATIVE

## 2013-03-14 LAB — CBC
HCT: 34.6 % — ABNORMAL LOW (ref 36.0–46.0)
Hemoglobin: 11.6 g/dL — ABNORMAL LOW (ref 12.0–15.0)
MCHC: 33.5 g/dL (ref 30.0–36.0)
RBC: 4.11 MIL/uL (ref 3.87–5.11)
RDW: 14 % (ref 11.5–15.5)
WBC: 9 10*3/uL (ref 4.0–10.5)

## 2013-03-14 LAB — MRSA PCR SCREENING: MRSA by PCR: NEGATIVE

## 2013-03-14 LAB — RPR: RPR Ser Ql: NONREACTIVE

## 2013-03-14 MED ORDER — DIPHENHYDRAMINE HCL 50 MG/ML IJ SOLN: 12.5000 mg | INTRAMUSCULAR | Status: DC | PRN

## 2013-03-14 MED ORDER — IBUPROFEN 600 MG PO TABS: 600.0000 mg | ORAL_TABLET | Freq: Four times a day (QID) | ORAL | Status: DC | PRN

## 2013-03-14 MED ORDER — SODIUM CHLORIDE 0.9 % IV SOLN: 250.0000 mL | INTRAVENOUS | Status: DC | PRN

## 2013-03-14 MED ORDER — NALBUPHINE SYRINGE 5 MG/0.5 ML
5.0000 mg | INJECTION | INTRAMUSCULAR | Status: DC | PRN
  Filled 2013-03-14: qty 0.5

## 2013-03-14 MED ORDER — PRENATAL MULTIVITAMIN CH
1.0000 | ORAL_TABLET | Freq: Every day | ORAL | Status: DC
  Administered 2013-03-15 – 2013-03-16 (×2): 1 via ORAL
  Filled 2013-03-14 (×2): qty 1

## 2013-03-14 MED ORDER — OXYCODONE-ACETAMINOPHEN 5-325 MG PO TABS: 1.0000 | ORAL_TABLET | ORAL | Status: DC | PRN

## 2013-03-14 MED ORDER — IBUPROFEN 600 MG PO TABS
600.0000 mg | ORAL_TABLET | Freq: Four times a day (QID) | ORAL | Status: DC
  Administered 2013-03-15 – 2013-03-16 (×7): 600 mg via ORAL
  Filled 2013-03-14 (×8): qty 1

## 2013-03-14 MED ORDER — SIMETHICONE 80 MG PO CHEW: 80.0000 mg | CHEWABLE_TABLET | ORAL | Status: DC | PRN

## 2013-03-14 MED ORDER — SODIUM BICARBONATE 8.4 % IV SOLN
INTRAVENOUS | Status: DC | PRN
  Administered 2013-03-14: 5 mL via EPIDURAL

## 2013-03-14 MED ORDER — TETANUS-DIPHTH-ACELL PERTUSSIS 5-2.5-18.5 LF-MCG/0.5 IM SUSP: 0.5000 mL | Freq: Once | INTRAMUSCULAR | Status: DC

## 2013-03-14 MED ORDER — EPHEDRINE 5 MG/ML INJ
10.0000 mg | INTRAVENOUS | Status: DC | PRN
  Filled 2013-03-14: qty 2
  Filled 2013-03-14: qty 4

## 2013-03-14 MED ORDER — ONDANSETRON HCL 4 MG/2ML IJ SOLN: 4.0000 mg | INTRAMUSCULAR | Status: DC | PRN

## 2013-03-14 MED ORDER — SODIUM CHLORIDE 0.9 % IJ SOLN: 3.0000 mL | Freq: Two times a day (BID) | INTRAMUSCULAR | Status: DC

## 2013-03-14 MED ORDER — WITCH HAZEL-GLYCERIN EX PADS: 1.0000 "application " | MEDICATED_PAD | CUTANEOUS | Status: DC | PRN

## 2013-03-14 MED ORDER — PHENYLEPHRINE 40 MCG/ML (10ML) SYRINGE FOR IV PUSH (FOR BLOOD PRESSURE SUPPORT)
80.0000 ug | PREFILLED_SYRINGE | INTRAVENOUS | Status: DC | PRN
  Filled 2013-03-14: qty 10
  Filled 2013-03-14: qty 2

## 2013-03-14 MED ORDER — LACTATED RINGERS IV SOLN
500.0000 mL | Freq: Once | INTRAVENOUS | Status: AC
  Administered 2013-03-14: 125 mL via INTRAVENOUS

## 2013-03-14 MED ORDER — DIPHENHYDRAMINE HCL 25 MG PO CAPS: 25.0000 mg | ORAL_CAPSULE | Freq: Four times a day (QID) | ORAL | Status: DC | PRN

## 2013-03-14 MED ORDER — BUTORPHANOL TARTRATE 1 MG/ML IJ SOLN: 1.0000 mg | Freq: Once | INTRAMUSCULAR | Status: DC

## 2013-03-14 MED ORDER — ZOLPIDEM TARTRATE 5 MG PO TABS: 5.0000 mg | ORAL_TABLET | Freq: Every evening | ORAL | Status: DC | PRN

## 2013-03-14 MED ORDER — FENTANYL CITRATE 0.05 MG/ML IJ SOLN
100.0000 ug | INTRAMUSCULAR | Status: DC | PRN
  Administered 2013-03-14: 100 ug via INTRAVENOUS
  Filled 2013-03-14 (×2): qty 2

## 2013-03-14 MED ORDER — SODIUM CHLORIDE 0.9 % IJ SOLN: 3.0000 mL | INTRAMUSCULAR | Status: DC | PRN

## 2013-03-14 MED ORDER — LACTATED RINGERS IV SOLN
500.0000 mL | INTRAVENOUS | Status: DC | PRN
Start: 2013-03-14 — End: 2013-03-14
  Administered 2013-03-14 (×2): 1000 mL via INTRAVENOUS

## 2013-03-14 MED ORDER — BENZOCAINE-MENTHOL 20-0.5 % EX AERO: 1.0000 "application " | INHALATION_SPRAY | CUTANEOUS | Status: DC | PRN

## 2013-03-14 MED ORDER — OXYTOCIN BOLUS FROM INFUSION: 500.0000 mL | INTRAVENOUS | Status: DC

## 2013-03-14 MED ORDER — SENNOSIDES-DOCUSATE SODIUM 8.6-50 MG PO TABS
2.0000 | ORAL_TABLET | ORAL | Status: DC
  Administered 2013-03-15 – 2013-03-16 (×2): 2 via ORAL
  Filled 2013-03-14 (×2): qty 2

## 2013-03-14 MED ORDER — FENTANYL 2.5 MCG/ML BUPIVACAINE 1/10 % EPIDURAL INFUSION (WH - ANES)
14.0000 mL/h | INTRAMUSCULAR | Status: DC | PRN
  Administered 2013-03-14: 14 mL/h via EPIDURAL
  Filled 2013-03-14 (×2): qty 125

## 2013-03-14 MED ORDER — PHENYLEPHRINE 40 MCG/ML (10ML) SYRINGE FOR IV PUSH (FOR BLOOD PRESSURE SUPPORT)
80.0000 ug | PREFILLED_SYRINGE | INTRAVENOUS | Status: DC | PRN
  Filled 2013-03-14: qty 2

## 2013-03-14 MED ORDER — DIBUCAINE 1 % RE OINT: 1.0000 "application " | TOPICAL_OINTMENT | RECTAL | Status: DC | PRN

## 2013-03-14 MED ORDER — CITRIC ACID-SODIUM CITRATE 334-500 MG/5ML PO SOLN
30.0000 mL | ORAL | Status: DC | PRN
  Administered 2013-03-14: 30 mL via ORAL
  Filled 2013-03-14: qty 15

## 2013-03-14 MED ORDER — ACETAMINOPHEN 325 MG PO TABS: 650.0000 mg | ORAL_TABLET | ORAL | Status: DC | PRN

## 2013-03-14 MED ORDER — ONDANSETRON HCL 4 MG/2ML IJ SOLN
4.0000 mg | Freq: Four times a day (QID) | INTRAMUSCULAR | Status: DC | PRN
  Administered 2013-03-14: 4 mg via INTRAVENOUS
  Filled 2013-03-14: qty 2

## 2013-03-14 MED ORDER — EPHEDRINE 5 MG/ML INJ
10.0000 mg | INTRAVENOUS | Status: DC | PRN
  Filled 2013-03-14: qty 2

## 2013-03-14 MED ORDER — OXYTOCIN 40 UNITS IN LACTATED RINGERS INFUSION - SIMPLE MED
62.5000 mL/h | INTRAVENOUS | Status: DC
  Administered 2013-03-14: 999 mL/h via INTRAVENOUS
  Filled 2013-03-14: qty 1000

## 2013-03-14 MED ORDER — ONDANSETRON HCL 4 MG PO TABS: 4.0000 mg | ORAL_TABLET | ORAL | Status: DC | PRN

## 2013-03-14 MED ORDER — LIDOCAINE HCL (PF) 1 % IJ SOLN
30.0000 mL | INTRAMUSCULAR | Status: DC | PRN
  Filled 2013-03-14 (×2): qty 30

## 2013-03-14 MED ORDER — LANOLIN HYDROUS EX OINT: TOPICAL_OINTMENT | CUTANEOUS | Status: DC | PRN

## 2013-03-14 NOTE — H&P (Signed)
Madeline [name] is a 22 y.o. female presenting for labor evaluation and rupture of membranes.   Maternal Medical History:  Reason for admission: Rupture of membranes and contractions.  Nausea.  Fetal activity: Perceived fetal activity is normal.      OB History   Grav Para Term Preterm Abortions TAB SAB Ect Mult Living   1              Past Medical History  Diagnosis Date  . Headache(784.0)     not requiring meds  . Infection     UTI  . Medical history non-contributory   . MRSA infection     reports hx- ? 4 yrs ago   Past Surgical History  Procedure Laterality Date  . No past surgeries     Family History: family history includes Asthma in her brother; Heart disease in her father; Restless legs syndrome in her mother and sister; Stroke in her paternal grandmother. Social History:  reports that she has never smoked. She has never used smokeless tobacco. She reports that she does not drink alcohol or use illicit drugs.   Prenatal Transfer Tool  Maternal Diabetes: No Genetic Screening: declined  Maternal Ultrasounds/Referrals: Normal Fetal Ultrasounds or other Referrals:  None Maternal Substance Abuse:  No Significant Maternal Medications:  None Significant Maternal Lab Results:  None Other Comments:  None  Review of Systems  Constitutional: Negative for fever.  Eyes: Negative for blurred vision.  Cardiovascular: Negative for chest pain.  Gastrointestinal: Positive for abdominal pain (contractions ). Negative for nausea, vomiting, diarrhea and constipation.  Genitourinary: Negative for dysuria, urgency and frequency.  Neurological: Negative for dizziness and headaches.    Dilation: 4 Effacement (%): 90 Station: -2;-1 Exam by:: jolynn Blood pressure 137/78, pulse 78, temperature 97.8 F (36.6 C), temperature source Oral, resp. rate 20, last menstrual period 06/04/2012. Maternal Exam:  Uterine Assessment: Contraction strength is moderate.  Contraction duration is  60 seconds. Contraction frequency is regular.   Abdomen: Patient reports no abdominal tenderness. Introitus: Ferning test: positive.   Pelvis: adequate for delivery.   Cervix: Cervix evaluated by digital exam.     Fetal Exam Fetal Monitor Review: Mode: ultrasound.   Baseline rate: 145.  Variability: moderate (6-25 bpm).   Pattern: accelerations present and no decelerations.    Fetal State Assessment: Category I - tracings are normal.     Physical Exam  Nursing note and vitals reviewed. Constitutional: She is oriented to person, place, and time. She appears well-developed and well-nourished. No distress.  Cardiovascular: Normal rate.   Respiratory: Effort normal.  GI: Soft.  Neurological: She is alert and oriented to person, place, and time.  Skin: Skin is warm and dry.  Psychiatric: She has a normal mood and affect.    Prenatal labs: ABO, Rh: O/Positive/-- (05/02 0000) Antibody: Negative (05/02 0000) Rubella: Nonimmune (05/02 0000) RPR:   Non-reactive HBsAg:   Negative  HIV: Non-reactive (05/02 0000)  GBS: Negative (10/14 0000)   Assessment/Plan: 1. Active labor at term    Reassuring M-F status Admit to labor and delivery Anticipate NSVD   Tawnya Crook 03/14/2013, 7:55 AM

## 2013-03-14 NOTE — MAU Note (Signed)
Water broke at 0649, clear fluid- still coming.  No bleeding. Is contracting, unsure how close. Feels some pressure

## 2013-03-14 NOTE — Anesthesia Preprocedure Evaluation (Addendum)

## 2013-03-14 NOTE — Progress Notes (Signed)
Reviewed PNR.  Questionalbe MRSA hx four years ago.  PCR sent for testing.

## 2013-03-14 NOTE — Anesthesia Procedure Notes (Signed)

## 2013-03-15 LAB — ABO/RH: ABO/RH(D): O POS

## 2013-03-15 NOTE — Lactation Note (Signed)
This note was copied from the chart of Madeline Marily Konczal. Lactation Consultation Note"Initial visit with mom. Mom reports that baby has been very sleepy. Reviewed wide open mouth and keeping the baby close to the breast throughout the feeding. Reviewed feeding cues and encouraged to feed whenever she sees them. Suggested skin to skin but mom states she is expecting visitors and wants to dress and wrap baby.Bf brochure given with resources for support after DC. To call for assist prn.  Patient Name: Madeline [name] [id number] Date: 03/15/2013 Reason for consult: Initial assessment   Maternal Data Formula Feeding for Exclusion: No Infant to breast within first hour of birth: Yes Has patient been taught Hand Expression?: Yes Does the patient have breastfeeding experience prior to this delivery?: No  Feeding Feeding Type: Breast Fed Length of feed: 0 min  LATCH Score/Interventions Latch: Too sleepy or reluctant, no latch achieved, no sucking elicited.  Audible Swallowing: None  Type of Nipple: Everted at rest and after stimulation  Comfort (Breast/Nipple): Soft / non-tender     Hold (Positioning): Assistance needed to correctly position infant at breast and maintain latch.  LATCH Score: 5  Lactation Tools Discussed/Used     Consult Status Consult Status: Follow-up Date: 03/15/13 Follow-up type: In-patient    Pamelia Hoit 03/15/2013, 11:02 AM

## 2013-03-15 NOTE — Anesthesia Postprocedure Evaluation (Signed)
  Anesthesia Post-op Note  Patient: Madeline [name]  Procedure(s) Performed: * No procedures listed *  Patient Location: PACU and Mother/Baby  Anesthesia Type:Epidural  Level of Consciousness: awake, alert  and oriented  Airway and Oxygen Therapy: Patient Spontanous Breathing  Post-op Pain: none  Post-op Assessment: Post-op Vital signs reviewed, Patient's Cardiovascular Status Stable, No headache, No backache, No residual numbness and No residual motor weakness  Post-op Vital Signs: Reviewed and stable  Complications: No apparent anesthesia complications

## 2013-03-15 NOTE — Lactation Note (Signed)
This note was copied from the chart of Madeline Maxine Fredman. Lactation Consultation Note Requested follow up visit by [facility] RN.  Upon my arrival baby already latched in cross cradle on left breast for 12 minutes and doing well.  Baby doesn't always cup tongue over lower gums.  Encouraged slight chin pull with latch and exorcises to relax jaw prior to latch. When removed from left breast small positional stripe noted, but mom denies pain.  Encouraged to keep deep latch through out feeding and massage towards baby only, not to pull breast away.  Instructed on how to position baby so nose is not buried in breast but touches with chin and cheeks. Attempted modified football latch on right to relax lower jaw, baby not latching to sleepy.  Encouraged mom to call if needing help with latch, may need nipple shield if baby is not aggressive with latching tonight.   Patient Name: Madeline [name] [id number] Date: 03/15/2013 Reason for consult: Follow-up assessment   Maternal Data    Feeding Feeding Type: Breast Fed  LATCH Score/Interventions Latch: Too sleepy or reluctant, no latch achieved, no sucking elicited. (few sucks, sucking his lips)  Audible Swallowing: None  Type of Nipple: Flat  Comfort (Breast/Nipple): Soft / non-tender     Hold (Positioning): Assistance needed to correctly position infant at breast and maintain latch. Intervention(s): Breastfeeding basics reviewed  LATCH Score: 4  Lactation Tools Discussed/Used     Consult Status Consult Status: Follow-up Date: 03/15/13 Follow-up type: In-patient    Jannifer Rodney 03/15/2013, 5:54 PM

## 2013-03-15 NOTE — Progress Notes (Signed)
Post Partum Day 1 Subjective: no complaints, up ad lib, voiding, tolerating PO and + flatus Feeling moderate discomfort in lower abdomen, continues to improve with medication Concerned that baby is not latching well and requests some assistance with breast feeding  Objective: Blood pressure 106/70, pulse 87, temperature 98.4 F (36.9 C), temperature source Oral, resp. rate 18, height 5\' 2"  (1.575 m), weight 222 lb (100.699 kg), last menstrual period 06/04/2012, SpO2 99.00%, unknown if currently breastfeeding.  Physical Exam:  General: alert, cooperative and no distress Lochia: appropriate Uterine Fundus: firm Incision: n/a DVT Evaluation: No evidence of DVT seen on physical exam. No significant calf/ankle edema.   Recent Labs  03/14/13 0821  HGB 11.6*  HCT 34.6*    Assessment/Plan: Pt delivered yesterday evening, no complications, 1st degree perineal repaired with single stitch and was hemostatic Plan for discharge tomorrow given late delivery yesterday Lactation consult, breast feeding Condoms for contraception  [facility] 4-6 weeks   LOS: 1 day   Anselm Lis 03/15/2013, 7:42 AM   I have seen and examined this patient and agree with above documentation in the resident's note.   Rulon Abide, M.D. [facility] Fellow 03/15/2013 11:30 AM

## 2013-03-16 ENCOUNTER — Inpatient Hospital Stay (HOSPITAL_COMMUNITY): Admission: RE | Admit: 2013-03-16 | Payer: Medicaid Other | Source: Ambulatory Visit

## 2013-03-16 MED ORDER — MEASLES, MUMPS & RUBELLA VAC ~~LOC~~ INJ
0.5000 mL | INJECTION | Freq: Once | SUBCUTANEOUS | Status: AC
  Administered 2013-03-16: 0.5 mL via SUBCUTANEOUS
  Filled 2013-03-16: qty 0.5

## 2013-03-16 MED ORDER — DOCUSATE SODIUM 100 MG PO CAPS: 100.0000 mg | ORAL_CAPSULE | Freq: Two times a day (BID) | ORAL | Status: DC | PRN

## 2013-03-16 MED ORDER — IBUPROFEN 600 MG PO TABS: 600.0000 mg | ORAL_TABLET | Freq: Four times a day (QID) | ORAL | Status: DC

## 2013-03-16 NOTE — Lactation Note (Signed)
This note was copied from the chart of Madeline Daney Moor. Lactation Consultation Note: Mom reports that baby cluster fed through the night. Now asleep at mom's side. Reports that he is latching on better and she feels pretty confident in latching him by herself. Has used pump and wants new tubing for it. Given- instructed in pump setup and cleaning of pieces. No questions at present. Reviewed BFS[facility] and OP appointments as resources for support after DC. To call prn  Patient Name: Madeline [name] [id number] Date: 03/16/2013 Reason for consult: Follow-up assessment   Maternal Data    Feeding   LATCH Score/Interventions                      Lactation Tools Discussed/Used     Consult Status Consult Status: Complete    Pamelia Hoit 03/16/2013, 9:08 AM

## 2013-03-16 NOTE — Discharge Summary (Signed)
Obstetric Discharge Summary Reason for Admission: onset of labor and rupture of membranes Prenatal Procedures: ultrasound Intrapartum Procedures: spontaneous vaginal delivery Postpartum Procedures: none Complications-Operative and Postpartum: none Hemoglobin  Date Value Range Status  03/14/2013 11.6* 12.0 - 15.0 g/dL Final  07/08/4399 02.7   Final     HCT  Date Value Range Status  03/14/2013 34.6* 36.0 - 46.0 % Final  09/06/2012 38   Final    Physical Exam:  General: alert, cooperative and no distress Lochia: appropriate Uterine Fundus: firm Incision: n/a DVT Evaluation: No evidence of DVT seen on physical exam. Negative Homan's sign. No cords or calf tenderness. No significant calf/ankle edema.  Discharge Diagnoses: Term Pregnancy-delivered Delivery Note on 03/14/13: At 7:02 PM a viable female was delivered via Vaginal, Spontaneous Delivery (Presentation: Middle Occiput Anterior). APGAR: 9, 9; weight: pending  Placenta status: Intact, Spontaneous. Cord: 3 vessels with the following complications: None.  Anesthesia: Epidural  Episiotomy: None  Lacerations: 1st degree;Perineal  Suture Repair: 3.0 vicryl  Est. Blood Loss (mL): 200  Mother pushed well for 30 mins to deliver viable female infant in direct OA position. No nuchal cord. Shoulders and body delivered without difficulty or delay. Baby vigorous and crying upon delivery and placed on maternal abdomen. Cord clamping delayed until umbilical cord stopped pulsing. Cord then clamped and cut by FOB. Third stage of labor actively managed with gentle cord traction and Oxytocin. Placenta delivered intact with 3 vessel cord. Attention was then turned to the perineum which sustained a first degree laceration. The laceration was hemostatic however 1 stitch was placed to reapproximate the posterior fourchette. Pt tolerated the procedure well. Mother and infant stable at conclusion of delivery. Counts correct and verified. Mom to postpartum. Infant  to couplet care/skin to skin.   Discharge Information: Date: 03/16/2013 Activity: pelvic rest Diet: routine Medications: Ibuprofen and Colace Condition: stable Instructions: refer to practice specific booklet Discharge to: home  F/U in [facility] for postpartum visit Contraceptive counseling done including discussion of LARCs.  Pt prefers condoms for contraception.  Discussed risks of pregnancy and recommendation for pt to take folic acid.  Pt states understanding.   Newborn Data: Live born female  Birth Weight: 7 lb 12.3 oz (3524 g) APGAR: 9, 9  Home with mother.  Madeline [name] 03/16/2013, 12:48 PM

## 2013-03-17 ENCOUNTER — Ambulatory Visit (HOSPITAL_COMMUNITY)
Admit: 2013-03-17 | Discharge: 2013-03-17 | Disposition: A | Discharge status: Home / Self Care | Payer: Medicaid Other | Attending: Family Medicine | Admitting: Family Medicine

## 2013-03-17 NOTE — Lactation Note (Addendum)
Adult Lactation Consultation Outpatient Visit Note  Patient Name: Madeline [name]                                       "Anette Riedel" Date of Birth: Feb 25, 1991                                                         DOL, 11-7, BW: 7-12, Gestational Age at Delivery: [redacted]w[redacted]d                                       11-10,  todays weight: 7-3.8, 3284 Type of Delivery:   Breastfeeding History: Frequency of Breastfeeding: cluster feeding at least every 1-2 hours Length of Feeding: 20-35 mins Voids: 5 Stools: 1 dark Green yellowish stool yesterday and 1 today  Supplementing / Method: Pumping:  Type of Pump:Medela electric and hand pump   Frequency:none  Volume:    Comments:  This appointment was scheduled for as a follow up from hospital discharge.  Anette Riedel is 21 hours old today. Mother has sore nipples. (R) nipple has a small white area on tip of nipple that is from pinching. Mother states that pain scale on initial latch is a #10, for a few seconds. Mother states that after a few seconds she has no pain. Mother and father states they do hear infant swallowing.     Consultation Evaluation:  Mothers breast are full and she hand expresses large amts of milk. Assist mother with football hold. Mother was taught nipple to nose latching . Infant goes on with shallow latch and after a few seconds gets good depth. Father was taught  how to pull infants chin down for wider gape.  Infant transferred 24 ml from the first breast. Several attempts to latch infant on second breast. Infant satisfied. Mother was taught cross cradle hold so she can rotate positions frequently.  Observed that infant does have a short frenulum. I feel that this is not a problem at this time. Evaluation maybe necessary at a later time.  Initial Feeding Assessment: Pre-feed [id number]  Post-feed [id number] Amount Transferred:24 ml  Total Breast milk Transferred this Visit: 24 ml Total Supplement Given:   Additional  Interventions: Continue to cue base feed and feed with early cueing.  Advised  to make sure infant has a wide gape with lips flanged. Rotate positons frequently. Discussed treatment plan for engorgement, ( advised mother to massage breast, ice for 10-15 mins and use pump to pre pump to latch infant) Follow up for weight check in am with Dr Deatra Canter Follow up with [facility] for one week visit.   Follow-Up  November 17 at 10:30     Stevan Born [facility] 03/17/2013, 10:34 AM

## 2013-03-17 NOTE — Progress Notes (Signed)
Post discharge chart review completed.  

## 2013-03-18 NOTE — H&P (Signed)
Attestation of Attending Supervision of Advanced Practitioner (CNM/NP): Evaluation and management procedures were performed by the Advanced Practitioner under my supervision and collaboration.  I have reviewed the Advanced Practitioner's note and chart, and I agree with the management and plan.  Alden Feagan 03/18/2013 4:25 PM

## 2013-03-18 NOTE — Discharge Summary (Signed)
Attestation of Attending Supervision of Advanced Practitioner (CNM/NP): Evaluation and management procedures were performed by the Advanced Practitioner under my supervision and collaboration. I have reviewed the Advanced Practitioner's note and chart, and I agree with the management and plan.  Acelin Ferdig H. 5:51 PM   

## 2013-03-19 ENCOUNTER — Encounter: Payer: Medicaid Other | Admitting: Family Medicine

## 2013-03-24 ENCOUNTER — Encounter: Payer: Self-pay | Admitting: Obstetrics and Gynecology

## 2013-03-24 ENCOUNTER — Ambulatory Visit (HOSPITAL_COMMUNITY)
Admission: RE | Admit: 2013-03-24 | Discharge: 2013-03-24 | Disposition: A | Discharge status: Home / Self Care | Payer: Medicaid Other | Source: Ambulatory Visit | Attending: Pediatrics | Admitting: Pediatrics

## 2013-03-24 ENCOUNTER — Ambulatory Visit (INDEPENDENT_AMBULATORY_CARE_PROVIDER_SITE_OTHER): Payer: Medicaid Other | Admitting: Obstetrics and Gynecology

## 2013-03-24 VITALS — BP 129/92 | HR 92 | Ht 62.0 in | Wt 195.6 lb

## 2013-03-24 DIAGNOSIS — O99345 Other mental disorders complicating the puerperium: Secondary | ICD-10-CM

## 2013-03-24 DIAGNOSIS — F3289 Other specified depressive episodes: Secondary | ICD-10-CM

## 2013-03-24 DIAGNOSIS — F53 Postpartum depression: Secondary | ICD-10-CM

## 2013-03-24 DIAGNOSIS — F329 Major depressive disorder, single episode, unspecified: Secondary | ICD-10-CM

## 2013-03-24 MED ORDER — SERTRALINE HCL 50 MG PO TABS: 50.0000 mg | ORAL_TABLET | Freq: Every day | ORAL | Status: DC

## 2013-03-24 NOTE — Progress Notes (Signed)
Pt requested appt today because she is feeling anxious and stressed. Her husband is returning to work tomorrow which may be a source of stress.  She states that she doesn't feel depressed.

## 2013-03-24 NOTE — Lactation Note (Signed)
Adult Lactation Consultation Outpatient Visit Note; Mom states she only has 15 minutes for visit today because she has a Ped appointment she has to get to. Did not want to weigh baby here. Mom reports that she has been giving formula due to being so tired and overwhelmed. States she feels like her milk is decreasing. Has not put the baby to the breast in a few days. Only pumped once yesterday. Discussed supply and demand. Encouraged to put baby to the breast or pump q 3 hours to promote milk supply. Reports that she has been crying a lot. Has appointment with OB this afternoon and encouraged to discuss this with him. No further questions at present. Encouraged to call if wants to make another appointment.    Patient Name: Madeline [name] Date of Birth: Feb 04, 1991 Gestational Age at Delivery: [redacted]w[redacted]d Type of Delivery:   Breastfeeding History: Frequency of Breastfeeding:  Length of Feeding:  Voids:  Stools:   Supplementing / Method: Pumping:  Type of Pump:   Frequency:  Volume:    Comments:    Consultation Evaluation:  Initial Feeding Assessment: Pre-feed Weight: Post-feed Weight: Amount Transferred: Comments:  Additional Feeding Assessment: Pre-feed Weight: Post-feed Weight: Amount Transferred: Comments:  Additional Feeding Assessment: Pre-feed Weight: Post-feed Weight: Amount Transferred: Comments:  Total Breast milk Transferred this Visit:  Total Supplement Given:   Additional Interventions:   Follow-Up      Pamelia Hoit 03/24/2013, 10:57 AM

## 2013-03-24 NOTE — Progress Notes (Signed)
Patient ID: Madeline [name], female   DOB: 1990/10/17, 22 y.o.   MRN: [id number] 22 yo G1P1 s/p SVD on 11/7 presenting today because she feels overwhelmed. Patient reports numerous crying spells but feels that they are less frequent over the past 3 days. Patient denies suicidal or homicidal ideation. Patient is anxious because her husband starting work Advertising account executive. Patient had a high postpartum depression score but her concerns and anxiety are appropriate for a new mother. She is caring to her child but when the infant cries she is not always sure what she needs to do and feels that she still has a lot of learning to do. She does get support from her sister in law and that is very reassuring to her. Rx for zoloft provided. Psychiatry numbers also given.   Patient desires IUD for contraception which will be placed at her postpartum visit on Dec 5. Patient advised to contact our office with any questions or concerns.

## 2013-03-24 NOTE — Patient Instructions (Signed)
Please contact the following doctors if you feel overwhelmed despite taking antidepressant Dr. Jannifer Franklin- [phone] Dr. Baron Sane (in [place])- [phone]   Postpartum Depression and Baby Blues The postpartum period begins right after the birth of a baby. During this time, there is often a great amount of joy and excitement. It is also a time of considerable changes in the life of the parent(s). Regardless of how many times a mother gives birth, each child brings new challenges and dynamics to the family. It is not unusual to have feelings of excitement accompanied by confusing shifts in moods, emotions, and thoughts. All mothers are at risk of developing postpartum depression or the "baby blues." These mood changes can occur right after giving birth, or they may occur many months after giving birth. The baby blues or postpartum depression can be mild or severe. Additionally, postpartum depression can resolve rather quickly, or it can be a long-term condition. CAUSES Elevated hormones and their rapid decline are thought to be a main cause of postpartum depression and the baby blues. There are a number of hormones that radically change during and after pregnancy. Estrogen and progesterone usually decrease immediately after delivering your baby. The level of thyroid hormone and various cortisol steroids also rapidly drop. Other factors that play a major role in these changes include major life events and genetics.  RISK FACTORS If you have any of the following risks for the baby blues or postpartum depression, know what symptoms to watch out for during the postpartum period. Risk factors that may increase the likelihood of getting the baby blues or postpartum depression include:  Havinga personal or family history of depression.  Having depression while being pregnant.  Having premenstrual or oral contraceptive-associated mood issues.  Having exceptional life stress.  Having marital  conflict.  Lacking a social support network.  Having a baby with special needs.  Having health problems such as diabetes. SYMPTOMS Baby blues symptoms include:  Brief fluctuations in mood, such as going from extreme happiness to sadness.  Decreased concentration.  Difficulty sleeping.  Crying spells, tearfulness.  Irritability.  Anxiety. Postpartum depression symptoms typically begin within the first month after giving birth. These symptoms include:  Difficulty sleeping or excessive sleepiness.  Marked weight loss.  Agitation.  Feelings of worthlessness.  Lack of interest in activity or food. Postpartum psychosis is a very concerning condition and can be dangerous. Fortunately, it is rare. Displaying any of the following symptoms is cause for immediate medical attention. Postpartum psychosis symptoms include:  Hallucinations and delusions.  Bizarre or disorganized behavior.  Confusion or disorientation. DIAGNOSIS  A diagnosis is made by an evaluation of your symptoms. There are no medical or lab tests that lead to a diagnosis, but there are various questionnaires that a caregiver may use to identify those with the baby blues, postpartum depression, or psychosis. Often times, a screening tool called the [place] Postnatal Depression Scale is used to diagnose depression in the postpartum period.  TREATMENT The baby blues usually goes away on its own in 1 to 2 weeks. Social support is often all that is needed. You should be encouraged to get adequate sleep and rest. Occasionally, you may be given medicines to help you sleep.  Postpartum depression requires treatment as it can last several months or longer if it is not treated. Treatment may include individual or group therapy, medicine, or both to address any social, physiological, and psychological factors that may play a role in the depression. Regular exercise, a healthy diet,  rest, and social support may also be strongly  recommended.  Postpartum psychosis is more serious and needs treatment right away. Hospitalization is often needed. HOME CARE INSTRUCTIONS  Get as much rest as you can. Nap when the baby sleeps.  Exercise regularly. Some women find yoga and walking to be beneficial.  Eat a balanced and nourishing diet.  Do little things that you enjoy. Have a cup of tea, take a bubble bath, read your favorite magazine, or listen to your favorite music.  Avoid alcohol.  Ask for help with household chores, cooking, grocery shopping, or running errands as needed. Do not try to do everything.  Talk to people close to you about how you are feeling. Get support from your partner, family members, friends, or other new moms.  Try to stay positive in how you think. Think about the things you are grateful for.  Do not spend a lot of time alone.  Only take medicine as directed by your caregiver.  Keep all your postpartum appointments.  Let your caregiver know if you have any concerns. SEEK MEDICAL CARE IF: You are having a reaction or problems with your medicine. SEEK IMMEDIATE MEDICAL CARE IF:  You have suicidal feelings.  You feel you may harm the baby or someone else. Document Released: 01/27/2004 Document Revised: 07/17/2011 Document Reviewed: 02/28/2011 [facility] Patient Information 2014 [place], [place].

## 2013-04-11 ENCOUNTER — Encounter: Payer: Self-pay | Admitting: *Deleted

## 2013-04-11 ENCOUNTER — Encounter: Payer: Self-pay | Admitting: Family Medicine

## 2013-04-11 ENCOUNTER — Ambulatory Visit (INDEPENDENT_AMBULATORY_CARE_PROVIDER_SITE_OTHER): Payer: Medicaid Other | Admitting: Family Medicine

## 2013-04-11 DIAGNOSIS — Z30430 Encounter for insertion of intrauterine contraceptive device: Secondary | ICD-10-CM

## 2013-04-11 DIAGNOSIS — IMO0001 Reserved for inherently not codable concepts without codable children: Secondary | ICD-10-CM

## 2013-04-11 DIAGNOSIS — Z01812 Encounter for preprocedural laboratory examination: Secondary | ICD-10-CM

## 2013-04-11 MED ORDER — LEVONORGESTREL 20 MCG/24HR IU IUD: 1.0000 | INTRAUTERINE_SYSTEM | Freq: Once | INTRAUTERINE | Status: DC

## 2013-04-11 MED ORDER — LEVONORGESTREL 20 MCG/24HR IU IUD
INTRAUTERINE_SYSTEM | Freq: Once | INTRAUTERINE | Status: AC
  Administered 2013-04-11: 1 via INTRAUTERINE

## 2013-04-11 NOTE — Progress Notes (Signed)
Subjective:     Madeline [name] is a 22 y.o. female who presents for a postpartum visit. She is 4 weeks postpartum following a spontaneous vaginal delivery. I have fully reviewed the prenatal and intrapartum course. The delivery was at 40+4 gestational weeks. Outcome: spontaneous vaginal delivery. Anesthesia: epidural. Postpartum course has been complicated with PP blues - rx'd zoloft, did not take, and significantly improved. Baby's course has been uncomplicated. Baby is feeding by bottle - gerber gentle. Bleeding no bleeding. Bowel function is normal. Bladder function is normal. Patient is sexually active. Contraception method is IUD. Postpartum depression screening: negative.  The following portions of the patient's history were reviewed and updated as appropriate: allergies, current medications, past family history, past medical history, past social history, past surgical history and problem list.  Review of Systems Pertinent items are noted in HPI.   Objective:    BP 119/80  Pulse 62  Temp(Src) 96.7 F (35.9 C) (Oral)  Ht 5\' 3"  (1.6 m)  Wt 89.54 kg (197 lb 6.4 oz)  BMI 34.98 kg/m2  Breastfeeding? No  General:  alert, cooperative, appears stated age and no distress   Breasts:   Not examined, neg ROS  Lungs: clear to auscultation bilaterally and normal percussion bilaterally  Heart:  regular rate and rhythm, S1, S2 normal, no murmur, click, rub or gallop and normal apical impulse  Abdomen: soft, non-tender; bowel sounds normal; no masses,  no organomegaly   Vulva:  normal  Vagina: normal vagina  Cervix:  no cervical motion tenderness and no lesions  Corpus: not examined  Adnexa:  not evaluated  Rectal Exam: Normal rectovaginal exam        Assessment:     Routine postpartum exam. Pap smear not done at today's visit.   Plan:    1. Contraception: IUD placed today. Note below 2. PPD resolved. Off all meds currently 3. Follow up in: as needed.   Madeline [name] is a 22 y.o.  year old G47P1001 Caucasian female who presents for placement of a Mirena IUD.  No LMP recorded. Last sexual intercourse was Wednesday with protection (condom), and pregnancy test today was negative  The risks and benefits of the method and placement have been thouroughly reviewed with the patient and all questions were answered.  Specifically the patient is aware of failure rate of 05/998, expulsion of the IUD and of possible perforation.  The patient is aware of irregular bleeding due to the method and understands the incidence of irregular bleeding diminishes with time.  Signed copy of informed consent in chart.   Time out was performed.  A Pederson speculum was placed in the vagina.  The cervix was visualized, prepped using Betadine, and grasped with a single tooth tenaculum. The uterus was found to be neutral and it sounded to 8 cm.  Mirena IUD placed per manufacturer's recommendations.   The strings were trimmed to 3 cm.  Sonogram was performed and the proper placement of the IUD was verified via transvaginal u/s.   The patient was given post procedure instructions, including signs and symptoms of infection and to check for the strings after each menses or each month, and refraining from intercourse or anything in the vagina for 3 days.  She was given a Mirena care card with date Mirena placed, and date Mirena to be removed.  She is scheduled for a return appointment after her first menses or 4 weeks.  Madeline [name] 04/11/2013 9:59 AM

## 2013-04-11 NOTE — Patient Instructions (Signed)
Levonorgestrel intrauterine device (IUD) What is this medicine? LEVONORGESTREL IUD (LEE voe nor jes trel) is a contraceptive (birth control) device. The device is placed inside the uterus by a healthcare professional. It is used to prevent pregnancy and can also be used to treat heavy bleeding that occurs during your period. Depending on the device, it can be used for 3 to 5 years. This medicine may be used for other purposes; ask your health care provider or pharmacist if you have questions. COMMON BRAND NAME(S): Mirena, Skyla What should I tell my health care provider before I take this medicine? They need to know if you have any of these conditions: -abnormal Pap smear -cancer of the breast, uterus, or cervix -diabetes -endometritis -genital or pelvic infection now or in the past -have more than one sexual partner or your partner has more than one partner -heart disease -history of an ectopic or tubal pregnancy -immune system problems -IUD in place -liver disease or tumor -problems with blood clots or take blood-thinners -use intravenous drugs -uterus of unusual shape -vaginal bleeding that has not been explained -an unusual or allergic reaction to levonorgestrel, other hormones, silicone, or polyethylene, medicines, foods, dyes, or preservatives -pregnant or trying to get pregnant -breast-feeding How should I use this medicine? This device is placed inside the uterus by a health care professional. Talk to your pediatrician regarding the use of this medicine in children. Special care may be needed. Overdosage: If you think you have taken too much of this medicine contact a poison control center or emergency room at once. NOTE: This medicine is only for you. Do not share this medicine with others. What if I miss a dose? This does not apply. What may interact with this medicine? Do not take this medicine with any of the following  medications: -amprenavir -bosentan -fosamprenavir This medicine may also interact with the following medications: -aprepitant -barbiturate medicines for inducing sleep or treating seizures -bexarotene -griseofulvin -medicines to treat seizures like carbamazepine, ethotoin, felbamate, oxcarbazepine, phenytoin, topiramate -modafinil -pioglitazone -rifabutin -rifampin -rifapentine -some medicines to treat HIV infection like atazanavir, indinavir, lopinavir, nelfinavir, tipranavir, ritonavir -St. John's wort -warfarin This list may not describe all possible interactions. Give your health care provider a list of all the medicines, herbs, non-prescription drugs, or dietary supplements you use. Also tell them if you smoke, drink alcohol, or use illegal drugs. Some items may interact with your medicine. What should I watch for while using this medicine? Visit your doctor or health care professional for regular check ups. See your doctor if you or your partner has sexual contact with others, becomes HIV positive, or gets a sexual transmitted disease. This product does not protect you against HIV infection (AIDS) or other sexually transmitted diseases. You can check the placement of the IUD yourself by reaching up to the top of your vagina with clean fingers to feel the threads. Do not pull on the threads. It is a good habit to check placement after each menstrual period. Call your doctor right away if you feel more of the IUD than just the threads or if you cannot feel the threads at all. The IUD may come out by itself. You may become pregnant if the device comes out. If you notice that the IUD has come out use a backup birth control method like condoms and call your health care provider. Using tampons will not change the position of the IUD and are okay to use during your period. What side effects may I   notice from receiving this medicine? Side effects that you should report to your doctor or  health care professional as soon as possible: -allergic reactions like skin rash, itching or hives, swelling of the face, lips, or tongue -fever, flu-like symptoms -genital sores -high blood pressure -no menstrual period for 6 weeks during use -pain, swelling, warmth in the leg -pelvic pain or tenderness -severe or sudden headache -signs of pregnancy -stomach cramping -sudden shortness of breath -trouble with balance, talking, or walking -unusual vaginal bleeding, discharge -yellowing of the eyes or skin Side effects that usually do not require medical attention (report to your doctor or health care professional if they continue or are bothersome): -acne -breast pain -change in sex drive or performance -changes in weight -cramping, dizziness, or faintness while the device is being inserted -headache -irregular menstrual bleeding within first 3 to 6 months of use -nausea This list may not describe all possible side effects. Call your doctor for medical advice about side effects. You may report side effects to FDA at 1-800-FDA-1088. Where should I keep my medicine? This does not apply. NOTE: This sheet is a summary. It may not cover all possible information. If you have questions about this medicine, talk to your doctor, pharmacist, or health care provider.  2014, [employer] Standard. (2011-05-25 13:54:04)  

## 2013-04-27 ENCOUNTER — Inpatient Hospital Stay (HOSPITAL_COMMUNITY): Payer: Medicaid Other

## 2013-04-27 ENCOUNTER — Encounter (HOSPITAL_COMMUNITY): Payer: Self-pay | Admitting: *Deleted

## 2013-04-27 ENCOUNTER — Inpatient Hospital Stay (HOSPITAL_COMMUNITY)
Admission: AD | Admit: 2013-04-27 | Discharge: 2013-04-27 | Disposition: A | Discharge status: Home / Self Care | Payer: Medicaid Other | Source: Ambulatory Visit | Attending: Family Medicine | Admitting: Family Medicine

## 2013-04-27 DIAGNOSIS — R109 Unspecified abdominal pain: Secondary | ICD-10-CM | POA: Insufficient documentation

## 2013-04-27 DIAGNOSIS — K8020 Calculus of gallbladder without cholecystitis without obstruction: Secondary | ICD-10-CM | POA: Insufficient documentation

## 2013-04-27 DIAGNOSIS — O9089 Other complications of the puerperium, not elsewhere classified: Secondary | ICD-10-CM | POA: Insufficient documentation

## 2013-04-27 LAB — COMPREHENSIVE METABOLIC PANEL
AST: 18 U/L (ref 0–37)
Albumin: 3.3 g/dL — ABNORMAL LOW (ref 3.5–5.2)
Calcium: 9.3 mg/dL (ref 8.4–10.5)
Chloride: 105 mEq/L (ref 96–112)
Creatinine, Ser: 0.74 mg/dL (ref 0.50–1.10)
GFR calc Af Amer: >90 mL/min (ref >90)
Sodium: 139 mEq/L (ref 135–145)
Total Bilirubin: 0.3 mg/dL (ref 0.3–1.2)
Total Protein: 6.8 g/dL (ref 6.0–8.3)

## 2013-04-27 LAB — CBC
HCT: 36.1 % (ref 36.0–46.0)
MCH: 27.6 pg (ref 26.0–34.0)
MCHC: 33 g/dL (ref 30.0–36.0)
MCV: 83.8 fL (ref 78.0–100.0)
Platelets: 317 10*3/uL (ref 150–400)
RDW: 13.1 % (ref 11.5–15.5)
WBC: 11.7 10*3/uL — ABNORMAL HIGH (ref 4.0–10.5)

## 2013-04-27 LAB — POCT PREGNANCY, URINE: Preg Test, Ur: NEGATIVE

## 2013-04-27 LAB — URINALYSIS, ROUTINE W REFLEX MICROSCOPIC
Bilirubin Urine: NEGATIVE
Nitrite: NEGATIVE
Protein, ur: NEGATIVE mg/dL
Urobilinogen, UA: 0.2 mg/dL (ref 0.0–1.0)

## 2013-04-27 LAB — URINE MICROSCOPIC-ADD ON

## 2013-04-27 MED ORDER — GI COCKTAIL ~~LOC~~
30.0000 mL | Freq: Once | ORAL | Status: AC
  Administered 2013-04-27: 30 mL via ORAL
  Filled 2013-04-27: qty 30

## 2013-04-27 MED ORDER — HYDROCODONE-ACETAMINOPHEN 5-325 MG PO TABS: 1.0000 | ORAL_TABLET | ORAL | Status: DC | PRN

## 2013-04-27 MED ORDER — SODIUM CHLORIDE 0.9 % IV BOLUS (SEPSIS)
1000.0000 mL | Freq: Once | INTRAVENOUS | Status: AC
  Administered 2013-04-27: 1000 mL via INTRAVENOUS

## 2013-04-27 MED ORDER — ONDANSETRON HCL 4 MG/2ML IJ SOLN
4.0000 mg | Freq: Once | INTRAMUSCULAR | Status: AC
  Administered 2013-04-27: 4 mg via INTRAVENOUS
  Filled 2013-04-27: qty 2

## 2013-04-27 MED ORDER — MORPHINE SULFATE 4 MG/ML IJ SOLN
4.0000 mg | Freq: Once | INTRAMUSCULAR | Status: AC
  Administered 2013-04-27: 4 mg via INTRAVENOUS
  Filled 2013-04-27: qty 1

## 2013-04-27 NOTE — MAU Provider Note (Signed)
History     CSN: [id number]  Arrival date and time: 04/27/13 2440   None     Chief Complaint  Patient presents with  . Abdominal Cramping   HPI  22 yo G1P1001 who presents for acute onset abdominal pain.    - pt is postpartum after NSVD on 03/14/13 - she had an IUD placed at her pp visit on 04/11/13 - woke up at 4am with sharp pains in the upper and right upper abdomen - had just eaten 2 hot dogs, 2 quesadillas and a pepsi 3 hours prior   - pain is constant and 10/10 then sometimes worsens with sharp stabbing like pain - having nausea and has vomited 3 times - NB/NB -pain radiates occasionally to the back  No fevers, chills, diarrhea, constipation, chest pain, SOB.    OB History   Grav Para Term Preterm Abortions TAB SAB Ect Mult Living   1 1 1       1       Past Medical History  Diagnosis Date  . Headache(784.0)     not requiring meds  . Infection     UTI  . Medical history non-contributory   . MRSA infection     reports hx- ? 4 yrs ago    Past Surgical History  Procedure Laterality Date  . No past surgeries      Family History  Problem Relation Age of Onset  . Asthma Brother   . Restless legs syndrome Mother   . Restless legs syndrome Sister   . Heart disease Father   . Stroke Paternal Grandmother     History  Substance Use Topics  . Smoking status: Never Smoker   . Smokeless tobacco: Never Used  . Alcohol Use: No   US Abdomen Limited Ruq  04/27/2013   CLINICAL DATA:  Right upper quadrant pain.  EXAM: US ABDOMEN LIMITED - RIGHT UPPER QUADRANT  COMPARISON:  None.  FINDINGS: Gallbladder  Multiple small mobile layering gallstones within the gallbladder. No wall thickening. Negative sonographic Murphy's.  Common bile duct  Diameter: Normal caliber, 4 mm. No filling defects/visualized stones.  Liver:  No focal lesion identified. Within normal limits in parenchymal echogenicity.  IMPRESSION: Cholelithiasis.  No sonographic evidence of acute cholecystitis.    Electronically Signed   By: Charlett Nose M.D.   On: 04/27/2013 09:18   Results for orders placed during the hospital encounter of 04/27/13 (from the past 24 hour(s))  CBC     Status: Abnormal   Collection Time    04/27/13  6:34 AM      Result Value Range   WBC 11.7 (*) 4.0 - 10.5 K/uL   RBC 4.31  3.87 - 5.11 MIL/uL   Hemoglobin 11.9 (*) 12.0 - 15.0 g/dL   HCT 10.2  72.5 - 36.6 %   MCV 83.8  78.0 - 100.0 fL   MCH 27.6  26.0 - 34.0 pg   MCHC 33.0  30.0 - 36.0 g/dL   RDW 44.0  34.7 - 42.5 %   Platelets 317  150 - 400 K/uL  COMPREHENSIVE METABOLIC PANEL     Status: Abnormal   Collection Time    04/27/13  6:34 AM      Result Value Range   Sodium 139  135 - 145 mEq/L   Potassium 3.0 (*) 3.5 - 5.1 mEq/L   Chloride 105  96 - 112 mEq/L   CO2 21  19 - 32 mEq/L   Glucose, Bld 98  70 - 99 mg/dL   BUN 10  6 - 23 mg/dL   Creatinine, Ser 0.98  0.50 - 1.10 mg/dL   Calcium 9.3  8.4 - 11.9 mg/dL   Total Protein 6.8  6.0 - 8.3 g/dL   Albumin 3.3 (*) 3.5 - 5.2 g/dL   AST 18  0 - 37 U/L   ALT 13  0 - 35 U/L   Alkaline Phosphatase 107  39 - 117 U/L   Total Bilirubin 0.3  0.3 - 1.2 mg/dL   GFR calc non Af Amer >90  >90 mL/min   GFR calc Af Amer >90  >90 mL/min  POCT PREGNANCY, URINE     Status: None   Collection Time    04/27/13  9:02 AM      Result Value Range   Preg Test, Ur NEGATIVE  NEGATIVE    Allergies: No Known Allergies  Prescriptions prior to admission  Medication Sig Dispense Refill  . levonorgestrel (MIRENA) 20 MCG/24HR IUD 1 Intra Uterine Device (1 each total) by Intrauterine route once.  1 each  0    Review of Systems  All other systems reviewed and are negative.   Physical Exam   Blood pressure 118/71, pulse 70, temperature 97.9 F (36.6 C), resp. rate 20. Filed Vitals:   04/27/13 0536 04/27/13 0617  BP: 149/72 118/71  Pulse: 80 70  Temp: 97.9 F (36.6 C)   Resp: 20     Physical Exam  Vitals reviewed. Constitutional: She is oriented to person, place, and  time. She appears well-developed and well-nourished.  HENT:  Head: Normocephalic.  Eyes: Conjunctivae are normal.  Cardiovascular: Normal rate and regular rhythm.   Respiratory: Effort normal and breath sounds normal.  GI: Soft. Bowel sounds are normal. She exhibits no distension. There is no tenderness. There is no rebound and no guarding.  Neurological: She is alert and oriented to person, place, and time.  Skin: Skin is warm and dry. No rash noted.  Psychiatric: She has a normal mood and affect.    MAU Course  Procedures  MDM CBC, CMP, lipase IV fluid bolus GI cocktail zofran Morphine for pain  ddx includes cholelithiasis vs. Biliary colic vs. Gastritis. Doubt appendicitis given location and relatively benign nature of exam.   Assessment and Plan   - CBC with slight leukocytosis likely stress response - CMP normal - GI cocktail brought pain down to a 5/10 - given pain not resolved, will send for [place] to eval gallbladder  Care transferred to Venia Carbon, NP.    -consulted with Dr. Debroah Loop prior to discharge home   A: Cholelithiasis   P: Discharge home in stable condition  RX: vicodin Follow up with [facility]; pt may need surgical consult. Phone number provided to the patient Low fat diet encouraged Return to MAU as needed, if symptoms worsen  Iona Hansen Rasch, NP 04/27/2013 1:30 PM     BECK, KELI L 04/27/2013, 7:58 AM

## 2013-04-27 NOTE — Progress Notes (Signed)
Pt vomited x 2 since arriving to MAU

## 2013-04-27 NOTE — MAU Note (Signed)
SVD 11/7. Mirena placed 12/5. Bleeding since mirana placed. Having upper abd pain. Worse than labor pains.

## 2013-04-28 ENCOUNTER — Encounter (HOSPITAL_COMMUNITY): Payer: Self-pay | Admitting: Emergency Medicine

## 2013-04-28 ENCOUNTER — Emergency Department (INDEPENDENT_AMBULATORY_CARE_PROVIDER_SITE_OTHER)
Admission: EM | Admit: 2013-04-28 | Discharge: 2013-04-28 | Disposition: A | Discharge status: Home / Self Care | Payer: Medicaid Other | Source: Home / Self Care | Attending: Emergency Medicine | Admitting: Emergency Medicine

## 2013-04-28 DIAGNOSIS — K8020 Calculus of gallbladder without cholecystitis without obstruction: Secondary | ICD-10-CM

## 2013-04-28 MED ORDER — HYDROCODONE-ACETAMINOPHEN 5-325 MG PO TABS: ORAL_TABLET | ORAL | Status: DC

## 2013-04-28 NOTE — ED Notes (Signed)
Pt   Reports        She  Was  Seen  Er      testerday  For  Possible  gallbladdder  Disease    She  stes  She  Was  Told  To  followup with a  Provider     She denys  Any pain  She   Is sitting upright on  Exam table  In no acute  distress

## 2013-04-28 NOTE — ED Notes (Signed)
PlAN OF  CARE  DISCUSSED

## 2013-04-28 NOTE — ED Provider Notes (Signed)
Chief Complaint:   Chief Complaint  Patient presents with  . Follow-up    History of Present Illness:    Madeline [name] is a 22 year old female who is 6 weeks postpartum. Yesterday at 4 AM she was awakened with sudden onset of sharp pain in the epigastrium without radiation. This was associated with nausea and vomiting but no fever or chills. She went to the emergency room at [facility]. She was found there to have gallstones but no evidence of cholecystitis. Her pain was relieved with pain medication she was sent home to followup with her primary care physician. Right now she states she feels fine with no pain at all. She is able to keep down solids and liquids. She denies any fever or chills. She went to her primary care Dr. this morning and was told that they couldn't see her until next month. She felt she needed something sooner and she was told to come here. She has had no prior history of gallbladder problems.  Review of Systems:  Other than noted above, the patient denies any of the following symptoms: Constitutional:  No fever, chills, fatigue, weight loss or anorexia. Lungs:  No cough or shortness of breath. Heart:  No chest pain, palpitations, syncope or edema.  No cardiac history. Abdomen:  No nausea, vomiting, hematememesis, melena, diarrhea, or hematochezia. GU:  No dysuria, frequency, urgency, or hematuria. Gyn:  No vaginal discharge, itching, abnormal bleeding, dyspareunia, or pelvic pain.  PMFSH:  Past medical history, family history, social history, meds, and allergies were reviewed along with nurse's notes.  No prior abdominal surgeries, past history of GI problems, STDs or GYN problems.  No history of aspirin or NSAID use.  No excessive alcohol intake.  Physical Exam:   Vital signs:  BP 122/74  Pulse 72  Temp(Src) 98.6 F (37 C) (Oral)  Resp 16  SpO2 100% Gen:  Alert, oriented, in no distress. Lungs:  Breath sounds clear and equal bilaterally.  No wheezes, rales  or rhonchi. Heart:  Regular rhythm.  No gallops or murmers.   Abdomen:  Soft, flat, nondistended. No organomegaly or mass. Bowel sounds are normally active. No tenderness, guarding, or rebound. Murphy sign and Murphy's punch were negative. Skin:  Clear, warm and dry.  No rash.  Assessment:  The encounter diagnosis was Cholelithiases.  Right now she is asymptomatic. Her abdomen is benign. She will need surgical followup. We made an appointment for her to see a surgeon tomorrow.  Plan:   1.  Meds:  The following meds were prescribed:   Discharge Medication List as of 04/28/2013  9:30 AM    START taking these medications   Details  !! HYDROcodone-acetaminophen (NORCO/VICODIN) 5-325 MG per tablet 1 to 2 tabs every 4 to 6 hours as needed for pain., Print     !! - Potential duplicate medications found. Please discuss with provider.      2.  Patient Education/Counseling:  The patient was given appropriate handouts, self care instructions, and instructed in symptomatic relief.  Instructed in a low-fat diet.  3.  Follow up:  The patient was told to follow up if no better in 3 to 4 days, if becoming worse in any way, and given some red flag symptoms such as fever, vomiting, or severe pain which would prompt immediate return to the emergency room.  Follow up with Dr. Hillery Hunter tomorrow morning at 11:20 AM.    Reuben Likes, MD 04/28/13 [phone]

## 2013-04-29 ENCOUNTER — Ambulatory Visit (INDEPENDENT_AMBULATORY_CARE_PROVIDER_SITE_OTHER): Payer: Medicaid Other | Admitting: General Surgery

## 2013-06-01 ENCOUNTER — Emergency Department (HOSPITAL_COMMUNITY)
Admission: EM | Admit: 2013-06-01 | Discharge: 2013-06-02 | Disposition: A | Discharge status: Home / Self Care | Payer: Medicaid Other | Attending: Emergency Medicine | Admitting: Emergency Medicine

## 2013-06-01 ENCOUNTER — Encounter (HOSPITAL_COMMUNITY): Payer: Self-pay | Admitting: Emergency Medicine

## 2013-06-01 DIAGNOSIS — Z79899 Other long term (current) drug therapy: Secondary | ICD-10-CM | POA: Insufficient documentation

## 2013-06-01 DIAGNOSIS — Z3202 Encounter for pregnancy test, result negative: Secondary | ICD-10-CM | POA: Insufficient documentation

## 2013-06-01 DIAGNOSIS — R110 Nausea: Secondary | ICD-10-CM | POA: Insufficient documentation

## 2013-06-01 DIAGNOSIS — K8020 Calculus of gallbladder without cholecystitis without obstruction: Secondary | ICD-10-CM | POA: Insufficient documentation

## 2013-06-01 NOTE — ED Notes (Signed)
Pt arrived to the ED with a complaint of abdominal pain.  Pt states's he has a dx of gallstones but has been unable to see surgeon due to financial constraints.  Pt states pain has been progressively gotten worse over the last three weeks.  Pt states she is unable to bear the pain for the last two days.

## 2013-06-02 ENCOUNTER — Emergency Department (HOSPITAL_COMMUNITY): Payer: Medicaid Other

## 2013-06-02 LAB — COMPREHENSIVE METABOLIC PANEL
ALT: 24 U/L (ref 0–35)
AST: 31 U/L (ref 0–37)
Albumin: 3.8 g/dL (ref 3.5–5.2)
Alkaline Phosphatase: 121 U/L — ABNORMAL HIGH (ref 39–117)
BUN: 18 mg/dL (ref 6–23)
CALCIUM: 9.4 mg/dL (ref 8.4–10.5)
CO2: 25 meq/L (ref 19–32)
CREATININE: 0.76 mg/dL (ref 0.50–1.10)
Chloride: 105 mEq/L (ref 96–112)
GLUCOSE: 98 mg/dL (ref 70–99)
Potassium: 4.3 mEq/L (ref 3.7–5.3)
Sodium: 143 mEq/L (ref 137–147)
TOTAL PROTEIN: 7.7 g/dL (ref 6.0–8.3)
Total Bilirubin: <0.2 mg/dL — ABNORMAL LOW (ref 0.3–1.2)

## 2013-06-02 LAB — URINALYSIS, ROUTINE W REFLEX MICROSCOPIC
BILIRUBIN URINE: NEGATIVE
Glucose, UA: NEGATIVE mg/dL
Hgb urine dipstick: NEGATIVE
Ketones, ur: NEGATIVE mg/dL
Leukocytes, UA: NEGATIVE
NITRITE: NEGATIVE
PH: 6 (ref 5.0–8.0)
PROTEIN: NEGATIVE mg/dL
Specific Gravity, Urine: 1.029 (ref 1.005–1.030)
Urobilinogen, UA: 1 mg/dL (ref 0.0–1.0)

## 2013-06-02 LAB — CBC
HCT: 37 % (ref 36.0–46.0)
Hemoglobin: 12.1 g/dL (ref 12.0–15.0)
MCH: 27.8 pg (ref 26.0–34.0)
MCHC: 32.7 g/dL (ref 30.0–36.0)
MCV: 84.9 fL (ref 78.0–100.0)
Platelets: 409 10*3/uL — ABNORMAL HIGH (ref 150–400)
RBC: 4.36 MIL/uL (ref 3.87–5.11)
RDW: 12.9 % (ref 11.5–15.5)
WBC: 15.2 10*3/uL — ABNORMAL HIGH (ref 4.0–10.5)

## 2013-06-02 LAB — LIPASE, BLOOD: Lipase: 24 U/L (ref 11–59)

## 2013-06-02 LAB — PREGNANCY, URINE: Preg Test, Ur: NEGATIVE

## 2013-06-02 MED ORDER — FENTANYL CITRATE 0.05 MG/ML IJ SOLN: 50.0000 ug | INTRAMUSCULAR | Status: DC | PRN

## 2013-06-02 MED ORDER — HYDROCODONE-ACETAMINOPHEN 5-325 MG PO TABS: 1.0000 | ORAL_TABLET | ORAL | Status: DC | PRN

## 2013-06-02 MED ORDER — ONDANSETRON HCL 4 MG/2ML IJ SOLN
4.0000 mg | Freq: Once | INTRAMUSCULAR | Status: DC
  Filled 2013-06-02: qty 2

## 2013-06-02 MED ORDER — SODIUM CHLORIDE 0.9 % IV SOLN
INTRAVENOUS | Status: DC
  Administered 2013-06-02: 01:00:00 via INTRAVENOUS

## 2013-06-02 NOTE — ED Provider Notes (Signed)
CSN: [id number]     Arrival date & time 06/01/13  2152 History   First MD Initiated Contact with Patient 06/02/13 0053     Chief Complaint  Patient presents with  . Abdominal Pain   HPI  History provided by the patient. Patient is a 23 year old female with a history of gallstones who presents with complaints of similar upper abdominal pain related to gallstones. She reports that her pain began several months ago when she was initially diagnosed with gallstones. Since that time she has had occasional bouts of sharp pain to her right upper abdomen usually associated around eating. This has become slightly more frequent but she states pains usually only last a short period time and she has been able to manage them by either waiting or occasionally using Vicodin which she was prescribed in the past. This evening however she began having sharp pains which did not improve. She has since run out of her Vicodin and did not use any treatments prior to arrival. Symptoms were associated with some nausea. She denies any vomiting. Denies any fevers or sweats. No other aggravating or alleviating factors. No other associated symptoms.    Past Medical History  Diagnosis Date  . Headache(784.0)     not requiring meds  . Infection     UTI  . Medical history non-contributory   . MRSA infection     reports hx- ? 4 yrs ago   Past Surgical History  Procedure Laterality Date  . No past surgeries     Family History  Problem Relation Age of Onset  . Asthma Brother   . Restless legs syndrome Mother   . Restless legs syndrome Sister   . Heart disease Father   . Stroke Paternal Grandmother    History  Substance Use Topics  . Smoking status: Never Smoker   . Smokeless tobacco: Never Used  . Alcohol Use: No   OB History   Grav Para Term Preterm Abortions TAB SAB Ect Mult Living   1 1 1       1      Review of Systems  Constitutional: Negative for fever, chills and diaphoresis.  Gastrointestinal:  Positive for nausea and abdominal pain. Negative for vomiting.  All other systems reviewed and are negative.    Allergies  Review of patient's allergies indicates no known allergies.  Home Medications   Current Outpatient Rx  Name  Route  Sig  Dispense  Refill  . HYDROcodone-acetaminophen (NORCO/VICODIN) 5-325 MG per tablet   Oral   Take 1 tablet by mouth every 4 (four) hours as needed for moderate pain.         Marland Kitchen ibuprofen (ADVIL,MOTRIN) 600 MG tablet   Oral   Take 400 mg by mouth every 6 (six) hours as needed for moderate pain.          Marland Kitchen levonorgestrel (MIRENA) 20 MCG/24HR IUD   Intrauterine   1 Intra Uterine Device (1 each total) by Intrauterine route once.   1 each   0   . HYDROcodone-acetaminophen (NORCO/VICODIN) 5-325 MG per tablet   Oral   Take 1-2 tablets by mouth every 4 (four) hours as needed for moderate pain.   20 tablet   0    BP 131/75  Pulse 77  Temp(Src) 97.7 F (36.5 C) (Oral)  Resp 17  Ht 5\' 2"  (1.575 m)  Wt 200 lb (90.719 kg)  BMI 36.57 kg/m2  SpO2 100%  LMP 05/29/2013  Breastfeeding? No Physical Exam  Nursing note and vitals reviewed. Constitutional: She is oriented to person, place, and time. She appears well-developed and well-nourished. No distress.  HENT:  Head: Normocephalic.  Cardiovascular: Normal rate and regular rhythm.   Pulmonary/Chest: Effort normal and breath sounds normal.  Abdominal: Soft. She exhibits no distension. There is no tenderness. There is no rebound, no guarding, no tenderness at McBurney's point and negative Murphy's sign.  Neurological: She is alert and oriented to person, place, and time.  Skin: Skin is warm and dry. No rash noted.  Psychiatric: She has a normal mood and affect. Her behavior is normal.    ED Course  Procedures   Patient seen and evaluated. She appears well sitting comfortably in no acute distress. She denies any pain or discomfort at this time. Her abdominal exam is benign. Discussed  work up plan with lab testing and ultrasound. Patient agrees.  Patient continued to be pain free during her emergency room stay. Ultrasound demonstrated a contracted gallbladder with some gallstones. No obstructing stones. No gallbladder inflammation or wall thickening. Her lab testing is unremarkable aside from elevated WBC. Patient is afebrile. At this time she wishes to return home. She will be given Gen. surgery referral and advised for strict return precautions.    Results for orders placed during the hospital encounter of 06/01/13  URINALYSIS, ROUTINE W REFLEX MICROSCOPIC      Result Value Range   Color, Urine YELLOW  YELLOW   APPearance CLEAR  CLEAR   Specific Gravity, Urine 1.029  1.005 - 1.030   pH 6.0  5.0 - 8.0   Glucose, UA NEGATIVE  NEGATIVE mg/dL   Hgb urine dipstick NEGATIVE  NEGATIVE   Bilirubin Urine NEGATIVE  NEGATIVE   Ketones, ur NEGATIVE  NEGATIVE mg/dL   Protein, ur NEGATIVE  NEGATIVE mg/dL   Urobilinogen, UA 1.0  0.0 - 1.0 mg/dL   Nitrite NEGATIVE  NEGATIVE   Leukocytes, UA NEGATIVE  NEGATIVE  PREGNANCY, URINE      Result Value Range   Preg Test, Ur NEGATIVE  NEGATIVE  CBC      Result Value Range   WBC 15.2 (*) 4.0 - 10.5 K/uL   RBC 4.36  3.87 - 5.11 MIL/uL   Hemoglobin 12.1  12.0 - 15.0 g/dL   HCT 11.937.0  14.736.0 - 82.946.0 %   MCV 84.9  78.0 - 100.0 fL   MCH 27.8  26.0 - 34.0 pg   MCHC 32.7  30.0 - 36.0 g/dL   RDW 56.212.9  13.011.5 - 86.515.5 %   Platelets 409 (*) 150 - 400 K/uL  COMPREHENSIVE METABOLIC PANEL      Result Value Range   Sodium 143  137 - 147 mEq/L   Potassium 4.3  3.7 - 5.3 mEq/L   Chloride 105  96 - 112 mEq/L   CO2 25  19 - 32 mEq/L   Glucose, Bld 98  70 - 99 mg/dL   BUN 18  6 - 23 mg/dL   Creatinine, Ser 7.840.76  0.50 - 1.10 mg/dL   Calcium 9.4  8.4 - 69.610.5 mg/dL   Total Protein 7.7  6.0 - 8.3 g/dL   Albumin 3.8  3.5 - 5.2 g/dL   AST 31  0 - 37 U/L   ALT 24  0 - 35 U/L   Alkaline Phosphatase 121 (*) 39 - 117 U/L   Total Bilirubin <0.2 (*) 0.3 - 1.2  mg/dL   GFR calc non Af Amer >90  >90 mL/min  GFR calc Af Amer >90  >90 mL/min  LIPASE, BLOOD      Result Value Range   Lipase 24  11 - 59 U/L       Imaging Review US Abdomen Limited Ruq  06/02/2013   CLINICAL DATA:  Cholelithiasis, abdominal pain  EXAM: US ABDOMEN LIMITED - RIGHT UPPER QUADRANT  COMPARISON:  21,014  FINDINGS: Gallbladder:  Gallbladder is contracted but contains several echogenic shadowing gallstones. Wall thickness measures 3.5 mm. No definite Murphy's sign.  Common bile duct:  Diameter: 4 mm.  Liver:  No intrahepatic biliary dilatation.  No focal hepatic abnormality.  Included views of the right kidney demonstrate no hydronephrosis  IMPRESSION: Contracted gallbladder containing small shadowing gallstones. No biliary dilatation   Electronically Signed   By: Ruel Favors M.D.   On: 06/02/2013 01:44     MDM   1. Cholelithiasis      Angus Seller, PA-C 06/02/13 2143

## 2013-06-02 NOTE — Discharge Instructions (Signed)
Your lab testing and ultrasound did not show any signs of emergent condition. You reported feeling improvement after treatment in the emergency room. Please followup with a general surgeon for continued evaluation and treatment. Use pain medications prescribed to help with your pain. Return for any worsening symptoms.    Cholelithiasis Cholelithiasis (also called gallstones) is a form of gallbladder disease in which gallstones form in your gallbladder. The gallbladder is an organ that stores bile made in the liver, which helps digest fats. Gallstones begin as small crystals and slowly grow into stones. Gallstone pain occurs when the gallbladder spasms and a gallstone is blocking the duct. Pain can also occur when a stone passes out of the duct.  RISK FACTORS  Being female.   Having multiple pregnancies. Health care providers sometimes advise removing diseased gallbladders before future pregnancies.   Being obese.  Eating a diet heavy in fried foods and fat.   Being older than 60 years and increasing age.   Prolonged use of medicines containing female hormones.   Having diabetes mellitus.   Rapidly losing weight.   Having a family history of gallstones (heredity).  SYMPTOMS  Nausea.   Vomiting.  Abdominal pain.   Yellowing of the skin (jaundice).   Sudden pain. It may persist from several minutes to several hours.  Fever.   Tenderness to the touch. In some cases, when gallstones do not move into the bile duct, people have no pain or symptoms. These are called "silent" gallstones.  TREATMENT Silent gallstones do not need treatment. In severe cases, emergency surgery may be required. Options for treatment include:  Surgery to remove the gallbladder. This is the most common treatment.  Medicines. These do not always work and may take 6 12 months or more to work.  Shock wave treatment (extracorporeal biliary lithotripsy). In this treatment an ultrasound machine  sends shock waves to the gallbladder to break gallstones into smaller pieces that can pass into the intestines or be dissolved by medicine. HOME CARE INSTRUCTIONS   Only take over-the-counter or prescription medicines for pain, discomfort, or fever as directed by your health care provider.   Follow a low-fat diet until seen again by your health care provider. Fat causes the gallbladder to contract, which can result in pain.   Follow up with your health care provider as directed. Attacks are almost always recurrent and surgery is usually required for permanent treatment.  SEEK IMMEDIATE MEDICAL CARE IF:   Your pain increases and is not controlled by medicines.   You have a fever or persistent symptoms for more than 2 3 days.   You have a fever and your symptoms suddenly get worse.   You have persistent nausea and vomiting.  MAKE SURE YOU:   Understand these instructions.  Will watch your condition.  Will get help right away if you are not doing well or get worse. Document Released: 04/20/2005 Document Revised: 12/25/2012 Document Reviewed: 10/16/2012 [facility] Patient Information 2014 [place], [place].

## 2013-06-03 NOTE — ED Provider Notes (Signed)
Medical screening examination/treatment/procedure(s) were performed by non-physician practitioner and as supervising physician I was immediately available for consultation/collaboration.   Sunnie NielsenBrian Meiah Zamudio, MD 06/03/13 [phone]

## 2013-12-19 ENCOUNTER — Encounter (HOSPITAL_COMMUNITY): Payer: Self-pay | Admitting: *Deleted

## 2013-12-19 ENCOUNTER — Inpatient Hospital Stay (HOSPITAL_COMMUNITY)
Admission: AD | Admit: 2013-12-19 | Discharge: 2013-12-19 | Disposition: A | Discharge status: Home / Self Care | Payer: Medicaid Other | Source: Ambulatory Visit | Attending: Obstetrics and Gynecology | Admitting: Obstetrics and Gynecology

## 2013-12-19 DIAGNOSIS — T8332XA Displacement of intrauterine contraceptive device, initial encounter: Secondary | ICD-10-CM

## 2013-12-19 DIAGNOSIS — T8389XA Other specified complication of genitourinary prosthetic devices, implants and grafts, initial encounter: Secondary | ICD-10-CM

## 2013-12-19 DIAGNOSIS — Z30432 Encounter for removal of intrauterine contraceptive device: Secondary | ICD-10-CM | POA: Insufficient documentation

## 2013-12-19 LAB — POCT PREGNANCY, URINE: PREG TEST UR: NEGATIVE

## 2013-12-19 NOTE — MAU Provider Note (Signed)
Attestation of Attending Supervision of Advanced Practitioner: Evaluation and management procedures were performed by the PA/NP/CNM/OB Fellow under my supervision/collaboration. Chart reviewed and agree with management and plan.  Janica Eldred V 12/19/2013 10:35 PM

## 2013-12-19 NOTE — MAU Note (Signed)
Got IUD in December. I can feel the plastic part and the strings beside it. Vag itching and used Monistat 1 and itching better but having a lot of d/c which is light green. No odor to d/c

## 2013-12-19 NOTE — MAU Provider Note (Signed)
History     CSN: [id number]  Arrival date and time: 12/19/13 4098   First Provider Initiated Contact with Patient 12/19/13 2054      Chief Complaint  Patient presents with  . Contraception  . Vaginal Itching   Vaginal Itching    Madeline [name] is a 23 y.o. G1P1001 who presents today because she can feel hard plastic coming from her cervix, and she thinks that her IUD may be coming out. She denies any pain or bleeding today. She had the IUD placed in the clinic in December.    Past Medical History  Diagnosis Date  . Headache(784.0)     not requiring meds  . Infection     UTI  . Medical history non-contributory   . MRSA infection     reports hx- ? 4 yrs ago    Past Surgical History  Procedure Laterality Date  . No past surgeries      Family History  Problem Relation Age of Onset  . Asthma Brother   . Restless legs syndrome Mother   . Restless legs syndrome Sister   . Heart disease Father   . Stroke Paternal Grandmother     History  Substance Use Topics  . Smoking status: Never Smoker   . Smokeless tobacco: Never Used  . Alcohol Use: No    Allergies: No Known Allergies  Prescriptions prior to admission  Medication Sig Dispense Refill  . HYDROcodone-acetaminophen (NORCO/VICODIN) 5-325 MG per tablet Take 1 tablet by mouth every 4 (four) hours as needed for moderate pain.      Marland Kitchen HYDROcodone-acetaminophen (NORCO/VICODIN) 5-325 MG per tablet Take 1-2 tablets by mouth every 4 (four) hours as needed for moderate pain.  20 tablet  0  . ibuprofen (ADVIL,MOTRIN) 600 MG tablet Take 400 mg by mouth every 6 (six) hours as needed for moderate pain.       Marland Kitchen levonorgestrel (MIRENA) 20 MCG/24HR IUD 1 Intra Uterine Device (1 each total) by Intrauterine route once.  1 each  0    ROS Physical Exam   Blood pressure 112/65, pulse 74, temperature 99.2 F (37.3 C), resp. rate 18, height 5\' 2"  (1.575 m), weight 78.654 kg (173 lb 6.4 oz), last menstrual period 11/28/2013,  not currently breastfeeding.  Physical Exam  Nursing note and vitals reviewed. Constitutional: She is oriented to person, place, and time. She appears well-developed and well-nourished. No distress.  Cardiovascular: Normal rate.   Respiratory: Effort normal.  GI: Soft. There is no tenderness.  Genitourinary:   External: no lesion Vagina: small amount of white discharge Cervix: pink, smooth, no CMT, white plastic part of IUD seen protruding from the os.  Uterus: NSSC Adnexa: NT    Neurological: She is oriented to person, place, and time.  Skin: Skin is warm and dry.  Psychiatric: She has a normal mood and affect.    MAU Course  Procedures Results for orders placed during the hospital encounter of 12/19/13 (from the past 24 hour(s))  POCT PREGNANCY, URINE     Status: None   Collection Time    12/19/13  7:38 PM      Result Value Ref Range   Preg Test, Ur NEGATIVE  NEGATIVE   Offered the patient Monday appointment to have the IUD pulled and replaced. Patient does not want a new IUD. She is planning another pregnancy, and would like to have the IUD removed today. IUD removed without difficulty. Patient tolerated well. Counseled about the quick return to fertility. Patient  verbalizes understanding.   Assessment and Plan   1. IUD migration, initial encounter    IUD removed today without difficulty Return to MAU as needed  Follow-up Information   [facility] ([facility])    Specialty:  Obstetrics and Gynecology   Contact information:   [address] [place] [id number] [phone]       Tawnya CrookHogan, Codie Krogh Donovan 12/19/2013, 9:13 PM

## 2013-12-19 NOTE — Discharge Instructions (Signed)
Menstruation °Menstruation is the monthly passing of blood, tissue, fluid and mucus, also know as a period. Your body is shedding the lining of the uterus. The flow, or amount of blood, usually lasts from 3-7 days each month. Hormones control the menstrual cycle. Hormones are a chemical substance produced by endocrine glands in the body to regulate different bodily functions. °The first menstrual period may start any time between age 25 years to 16 years. However, it usually starts around age 12 years. Some girls have regular monthly menstrual cycles right from the beginning. However, it is not unusual to have only a couple of drops of blood or spotting when you first start menstruating. It is also not unusual to have two periods a month or miss a month or two when first starting your periods. °SYMPTOMS  °· Mild to moderate abdominal cramps. °· Aching or pain in the lower back area. °Symptoms may occur 5-10 days before your menstrual period starts. These symptoms are referred to as premenstrual syndrome (PMS). These symptoms can include: °· Headache. °· Breast tenderness and swelling. °· Bloating. °· Tiredness (fatigue). °· Mood changes. °· Craving for certain foods. °These are normal signs and symptoms and can vary in severity. To help relieve these problems, ask your caregiver if you can take over-the-counter medications for pain or discomfort. If the symptoms are not controllable, see your caregiver for help.  °HORMONES INVOLVED IN MENSTRUATION °Menstruation comes about because of hormones produced by the pituitary gland in the brain and the ovaries that affect the uterine lining. °First, the pituitary gland in the brain produces the hormone follicle stimulating hormone (FSH). FSH stimulates the ovaries to produce estrogen, which thickens the uterine lining and begins to develop an egg in the ovary. About 14 days later, the pituitary gland produces another hormone called luteinizing hormone (LH). LH causes the egg  to come out of a sac in the ovary (ovulation). The empty sac on the ovary called the corpus luteum is stimulated by another hormone from the pituitary gland called luteotropin. The corpus luteum begins to produce the estrogen and progesterone hormone. The progesterone hormone prepares the lining of the uterus to have the fertilized egg (egg combined with sperm) attach to the lining of the uterus and begin to develop into a fetus. If the egg is not fertilized, the corpus luteum stops producing estrogen and progesterone, it disappears, the lining of the uterus sloughs off and a menstrual period begins. Then the menstrual cycle starts all over again and will continue monthly unless pregnancy occurs or menopause begins. °The secretion of hormones is complex. Various parts of the body become involved in many chemical activities. Female sex hormones have other functions in a woman's body as well. Estrogen increases a woman's sex drive (libido). It naturally helps body get rid of fluids (diuretic). It also aids in the process of building new bone. Therefore, maintaining hormonal health is essential to all levels of a woman's well being. These hormones are usually present in normal amounts and cause you to menstruate. It is the relationship between the (small) levels of the hormones that is critical. When the balance is upset, menstrual irregularities can occur. °HOW DOES THE MENSTRUAL CYCLE HAPPEN? °· Menstrual cycles vary in length from 21-35 days with an average of 29 days. The cycle begins on the first day of bleeding. At this time, the pituitary gland in the brain releases FSH that travels through the bloodstream to the ovaries. The FSH stimulates the follicles in the   ovaries. This prepares the body for ovulation that occurs around the 14th day of the cycle. The ovaries produce estrogen, and this makes sure conditions are right in the uterus for implantation of the fertilized egg. °· When the levels of estrogen reach a  high enough level, it signals the gland in the brain (pituitary gland) to release a surge of LH. This causes the release of the ripest egg from its follicle (ovulation). Usually only one follicle releases one egg, but sometimes more than one follicle releases an egg especially when stimulating the ovaries for in vitro fertilization. The egg can then be collected by either fallopian tube to await fertilization. The burst follicle within the ovary that is left behind is now called the corpus luteum or "yellow body." The corpus luteum continues to give off (secrete) reduced amounts of estrogen. This closes and hardens the cervix. It dries up the mucus to the naturally infertile condition. °· The corpus luteum also begins to give off greater amounts of progesterone. This causes the lining of the uterus (endometrium) to thicken even more in preparation for the fertilized egg. The egg is starting to journey down from the fallopian tube to the uterus. It also signals the ovaries to stop releasing eggs. It assists in returning the cervical mucus to its infertile state. °· If the egg implants successfully into the womb lining and pregnancy occurs, progesterone levels will continue to raise. It is often this hormone that gives some pregnant women a feeling of well being, like a "natural high." Progesterone levels drop again after childbirth. °· If fertilization does not occur, the corpus luteum dies, stopping the production of hormones. This sudden drop in progesterone causes the uterine lining to break down, accompanied by blood (menstruation). °· This starts the cycle back at day 1. The whole process starts all over again. Woman go through this cycle every month from puberty to menopause. Women have breaks only for pregnancy and breastfeeding (lactation), unless the woman has health problems that affect the female hormone system or chooses to use oral contraceptives to have unnatural menstrual periods. °HOME CARE  INSTRUCTIONS  °· Keep track of your periods by using a calendar. °· If you use tampons, get the least absorbent to avoid toxic shock syndrome. °· Do not leave tampons in the vagina over night or longer than 6 hours. °· Wear a sanitary pad over night. °· Exercise 3-5 times a week or more. °· Avoid foods and drinks that you know will make your symptoms worse before or during your period. °SEEK MEDICAL CARE IF:  °· You develop a fever with your period. °· Your periods are lasting more than 7 days. °· Your period is so heavy that you have to change pads or tampons every 30 minutes. °· You develop clots with your period and never had clots before. °· You cannot get relief from over-the-counter medication for your symptoms. °· Your period has not started, and it has been longer than 35 days. °Document Released: 04/14/2002 Document Revised: 04/29/2013 Document Reviewed: 11/21/2012 °ExitCare® Patient Information ©2015 [employer]. This information is not intended to replace advice given to you by your health care provider. Make sure you discuss any questions you have with your health care provider. ° °

## 2013-12-26 IMAGING — US US ABDOMEN LIMITED
1 series · 14 of 25 positions shown · non-contrast
Comparison: None.

CLINICAL DATA: Right upper quadrant pain.

EXAM:
US ABDOMEN LIMITED - RIGHT UPPER QUADRANT

[Series 1: us abdomen limited ruq/ascites · 14 of 53 slices shown]
[im 1/53]
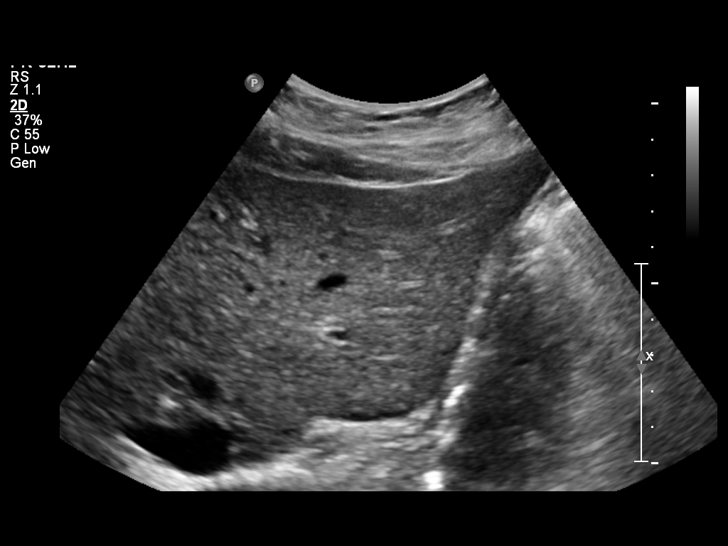
[im 5/53]
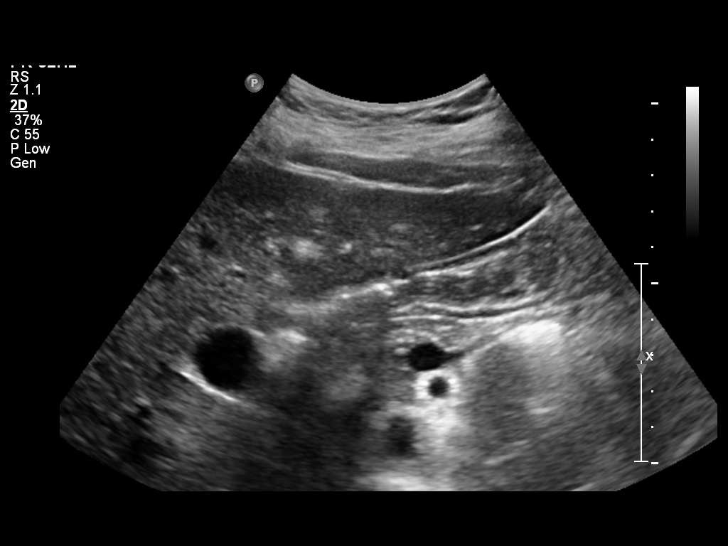
[im 9/53]
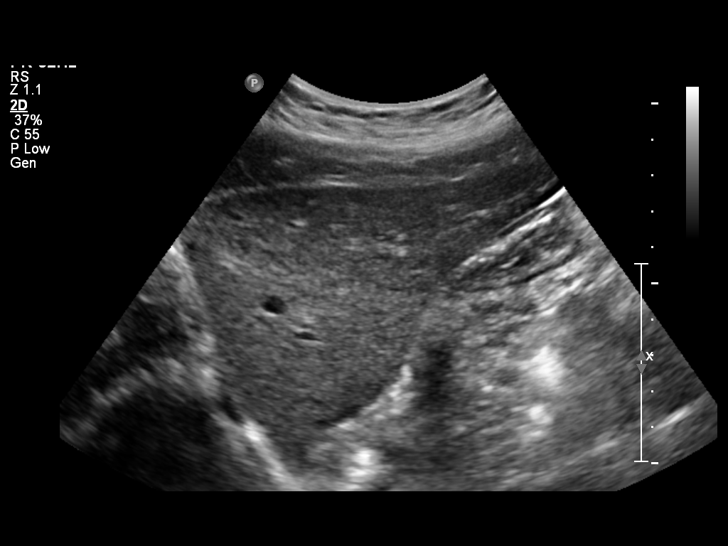
[im 14/53]
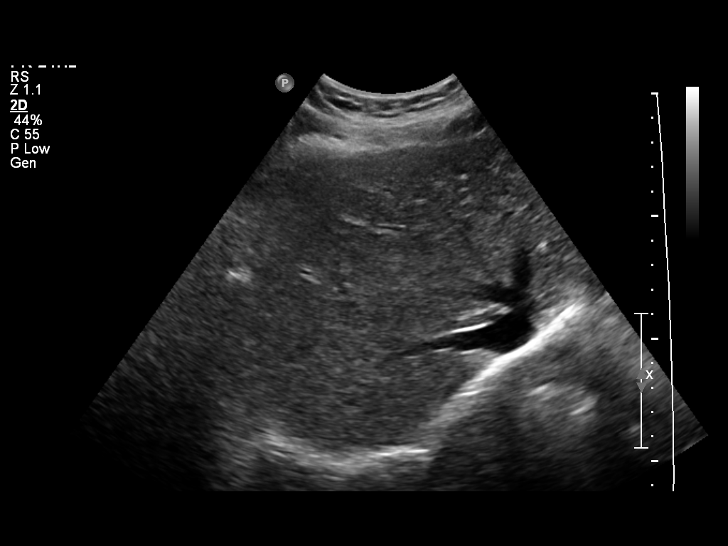
[im 18/53]
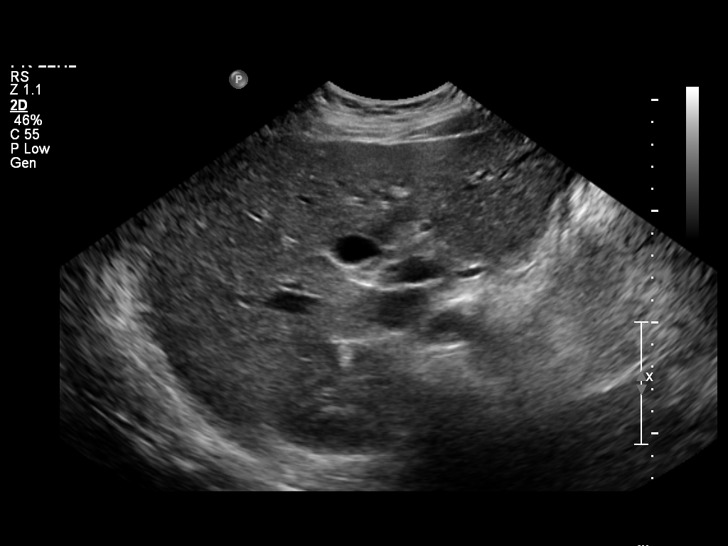
[im 20/53]
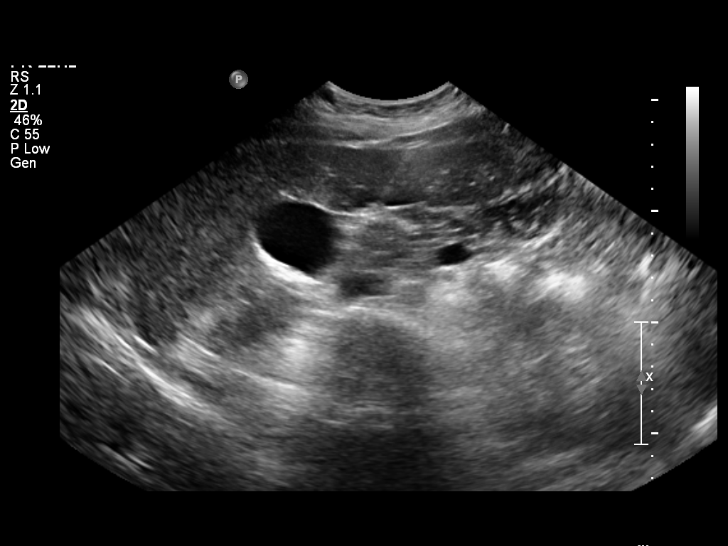
[im 24/53]
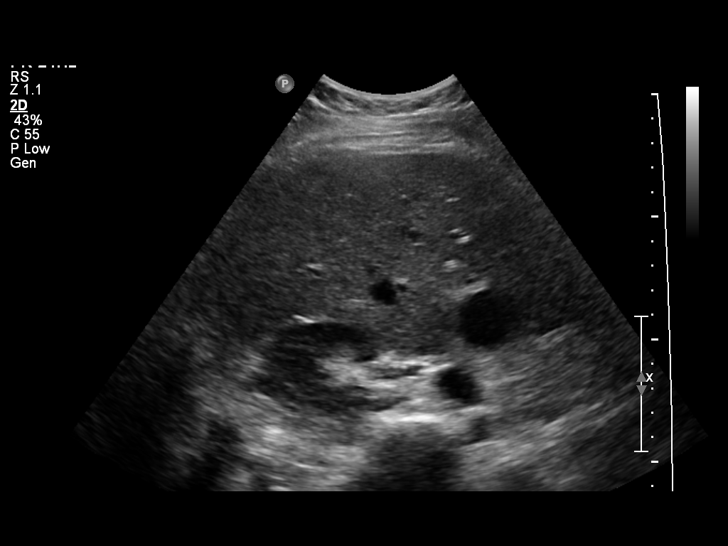
[im 29/53]
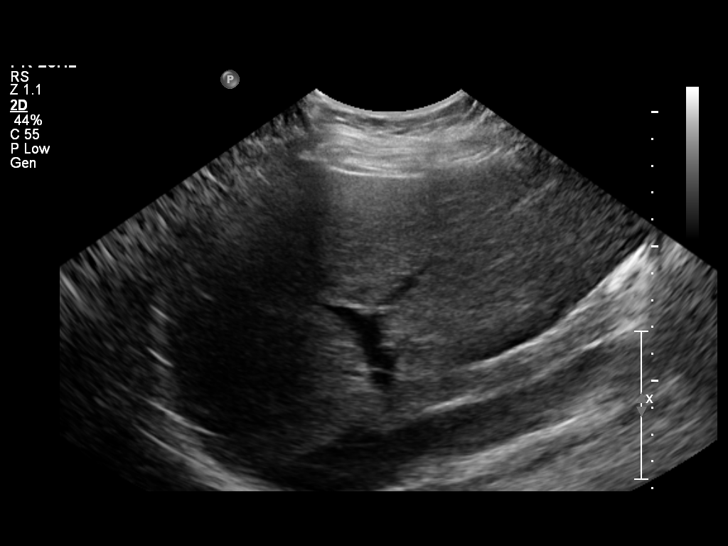
[im 33/53]
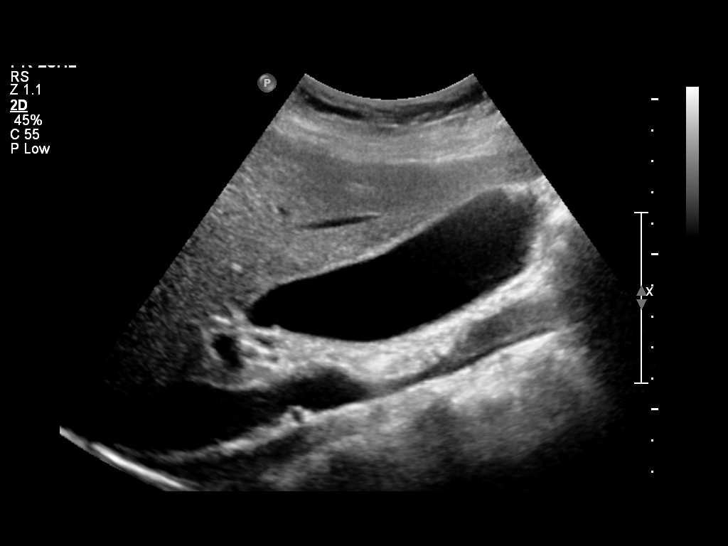
[im 35/53]
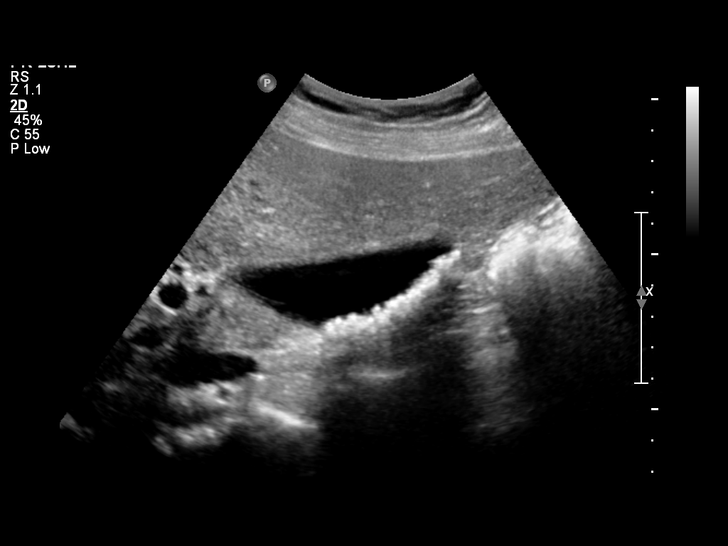
[im 40/53]
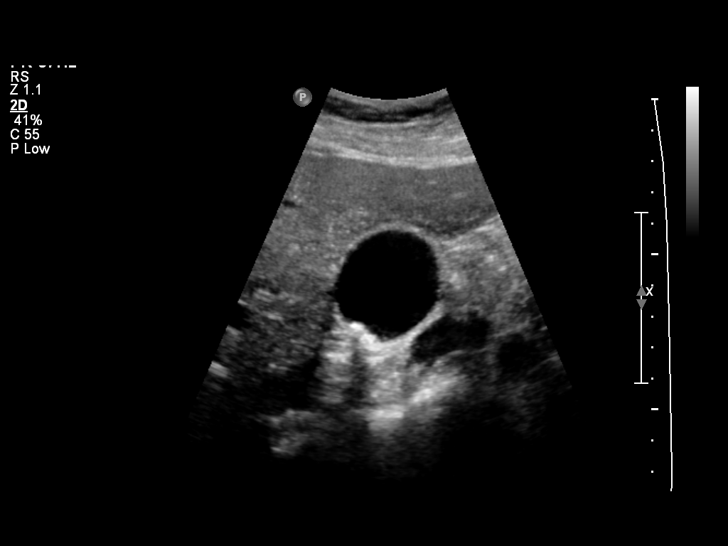
[im 44/53]
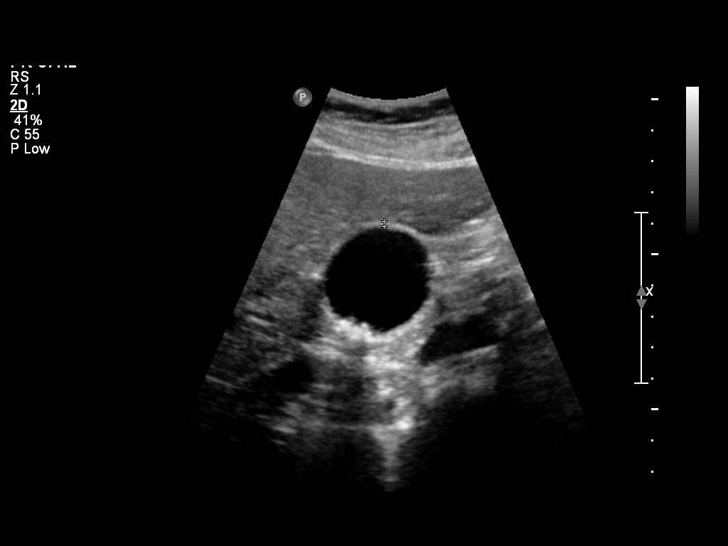
[im 48/53]
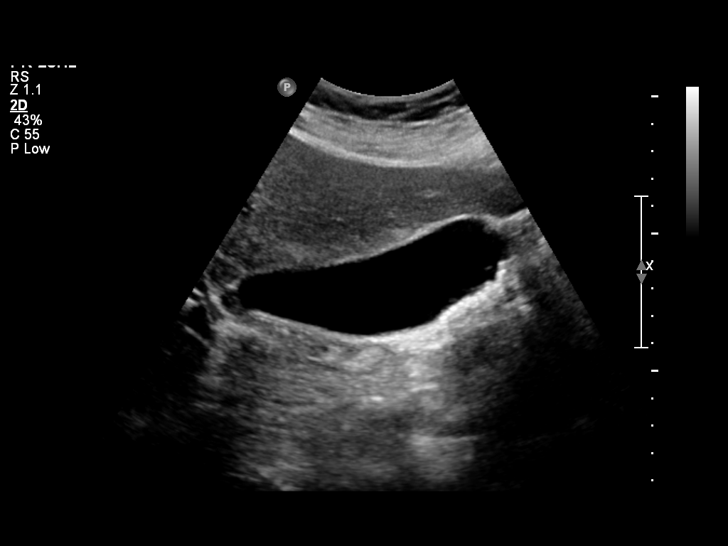
[im 53/53]
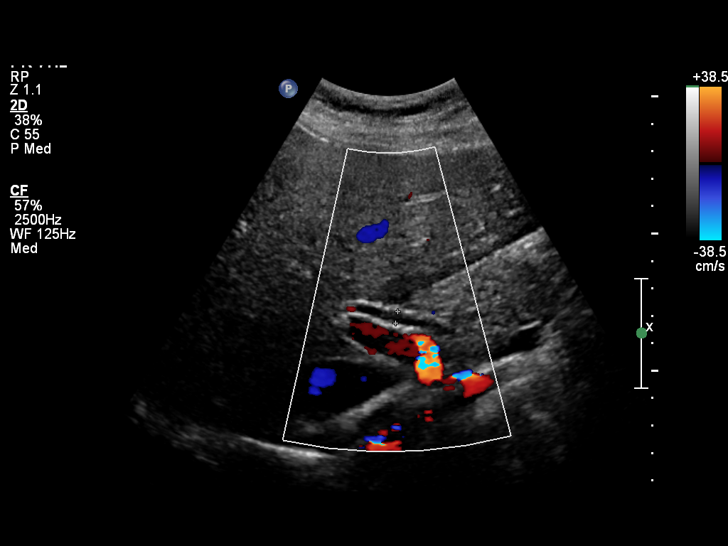

[14 of 25 positions shown; findings below may reference images not displayed]

FINDINGS: Gallbladder

Multiple small mobile layering gallstones within the gallbladder. No
wall thickening. Negative sonographic Klpigbb.

Common bile duct

Diameter: Normal caliber, 4 mm. No filling defects/visualized
stones.

Liver:

No focal lesion identified. Within normal limits in parenchymal
echogenicity.
IMPRESSION: Cholelithiasis.  No sonographic evidence of acute cholecystitis.

## 2014-01-31 IMAGING — US US ABDOMEN LIMITED
1 series · 14 of 25 positions shown · non-contrast
Comparison: [DATE]

CLINICAL DATA: Cholelithiasis, abdominal pain

EXAM:
US ABDOMEN LIMITED - RIGHT UPPER QUADRANT

[Series 1: us abdomen limited · 0.24mm/px · 14 of 65 slices shown]
[im 1/65]
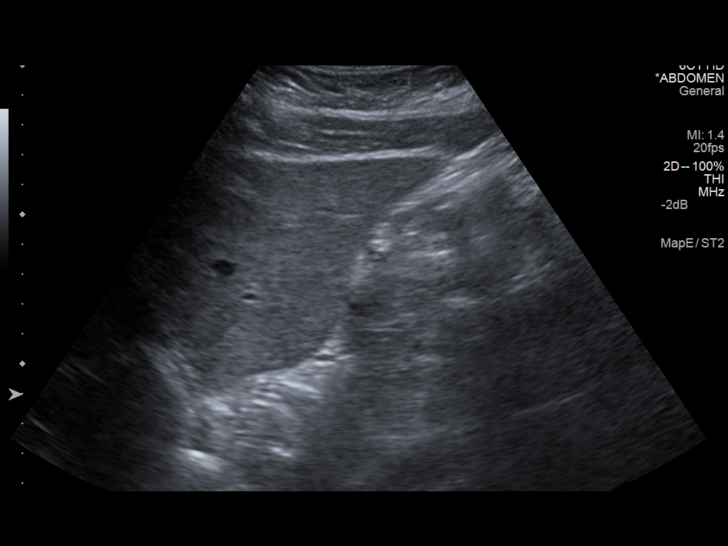
[im 6/65]
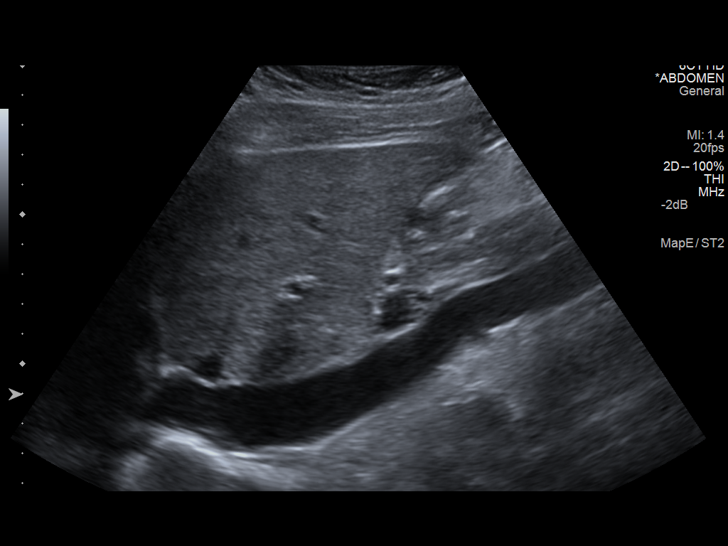
[im 11/65]
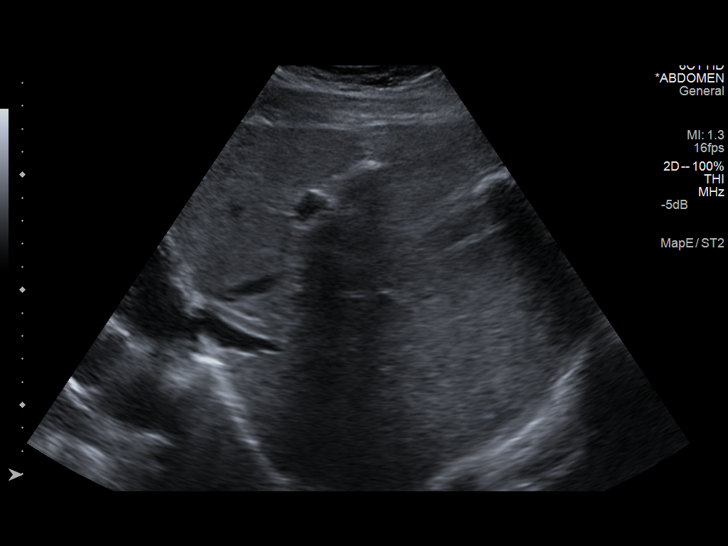
[im 17/65]
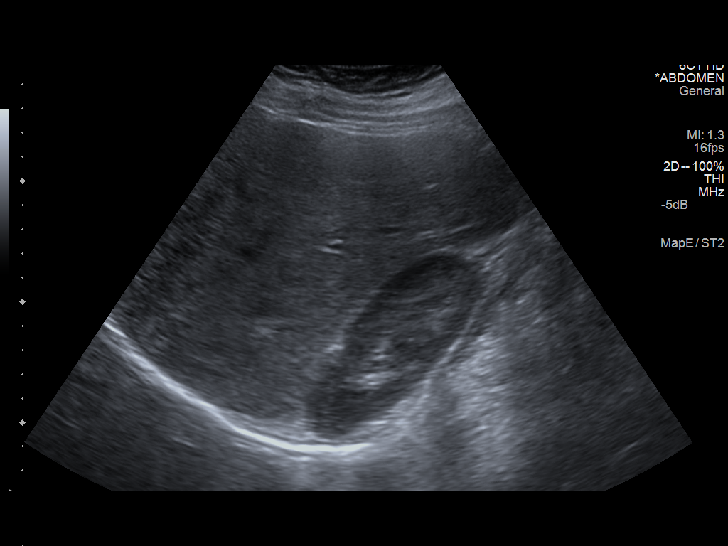
[im 22/65]
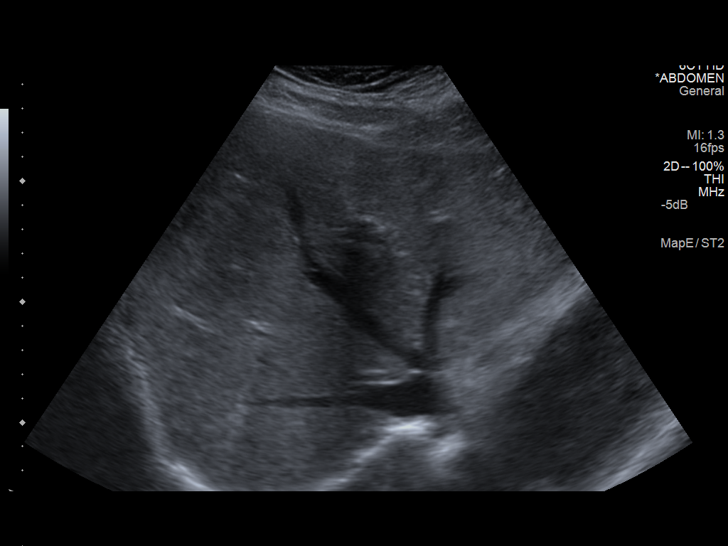
[im 25/65]
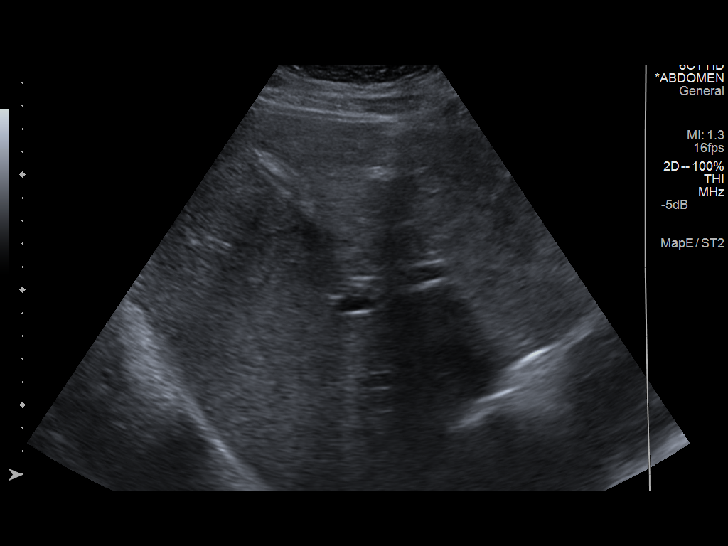
[im 30/65]
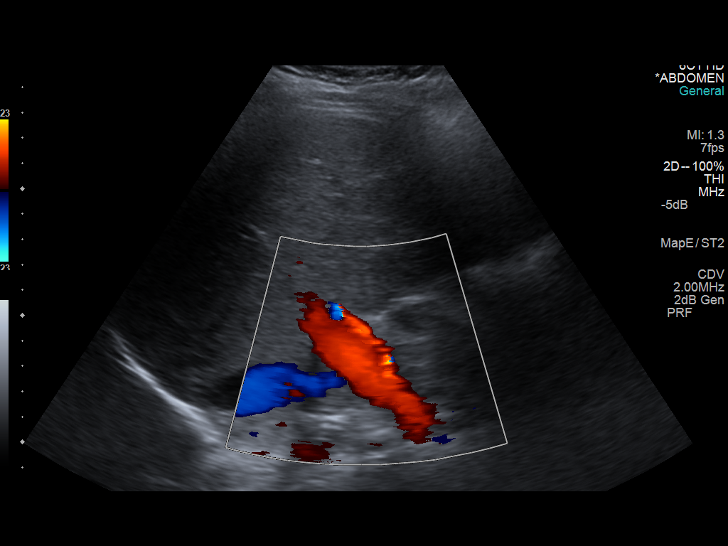
[im 35/65]
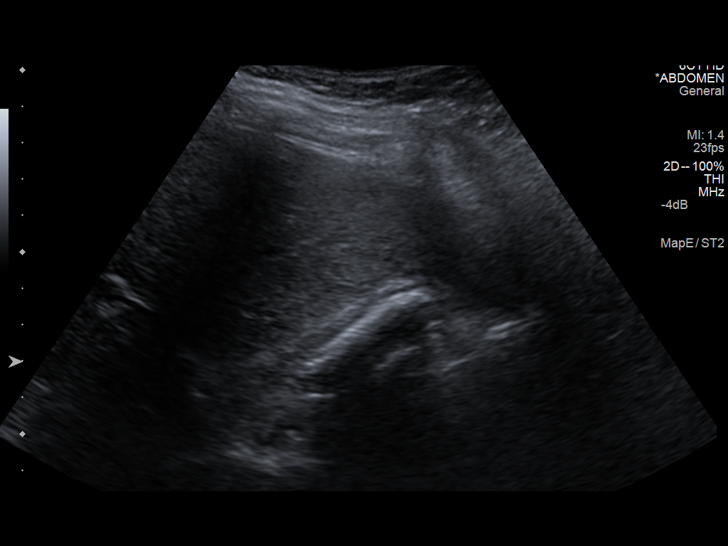
[im 41/65]
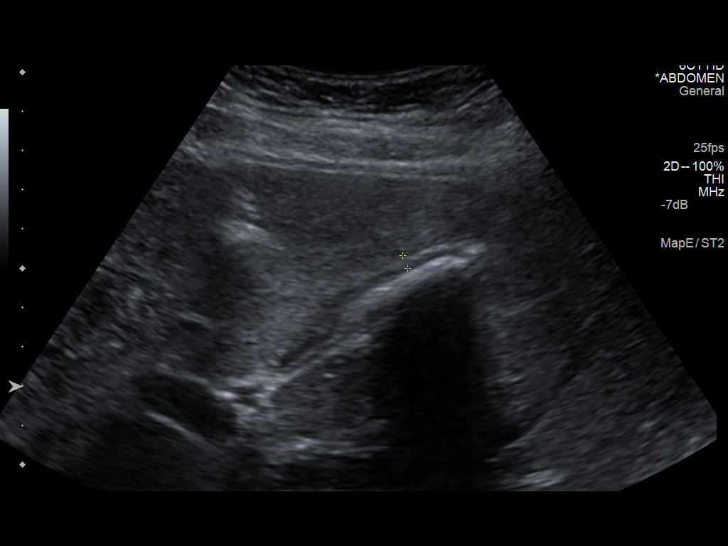
[im 43/65]
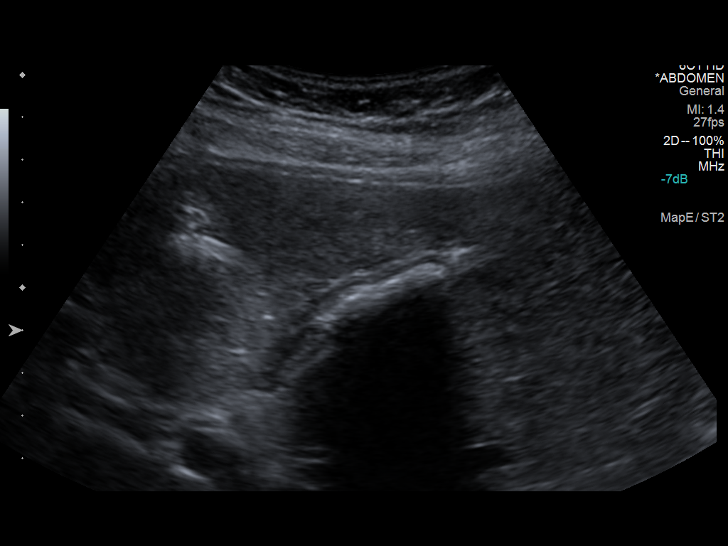
[im 49/65]
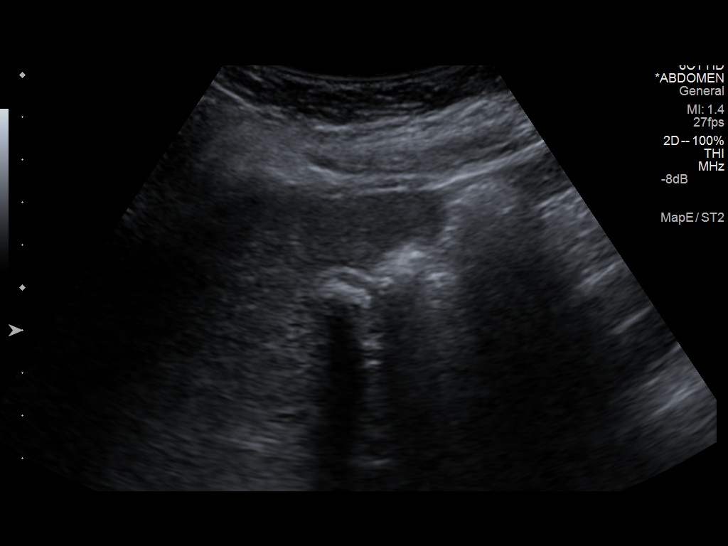
[im 54/65]
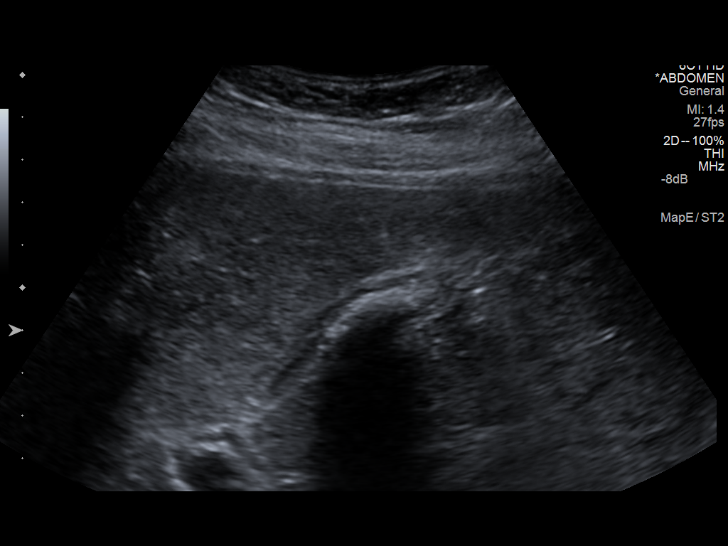
[im 59/65]
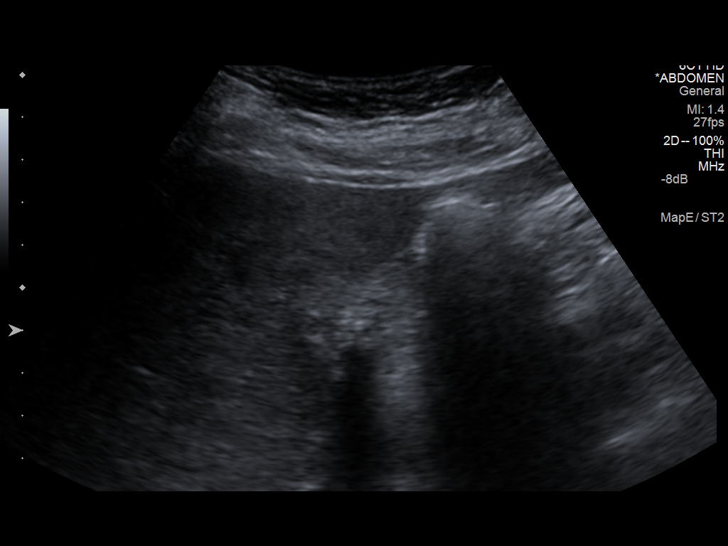
[im 65/65]
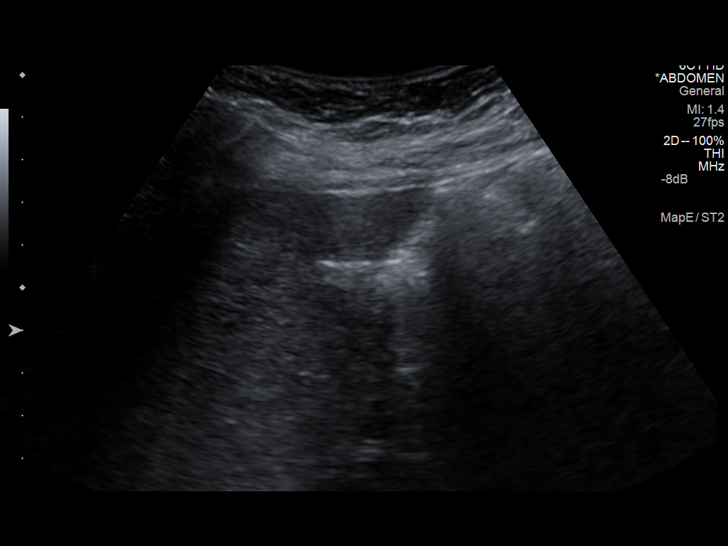

[14 of 25 positions shown; findings below may reference images not displayed]

FINDINGS: Gallbladder:

Gallbladder is contracted but contains several echogenic shadowing
gallstones. Wall thickness measures 3.5 mm. No definite Murphy's
sign.

Common bile duct:

Diameter: 4 mm.

Liver:

No intrahepatic biliary dilatation.  No focal hepatic abnormality.

Included views of the right kidney demonstrate no hydronephrosis
IMPRESSION: Contracted gallbladder containing small shadowing gallstones. No
biliary dilatation

## 2014-03-09 ENCOUNTER — Encounter (HOSPITAL_COMMUNITY): Payer: Self-pay | Admitting: *Deleted

## 2014-09-14 ENCOUNTER — Encounter (HOSPITAL_COMMUNITY): Payer: Self-pay | Admitting: Emergency Medicine

## 2014-09-14 ENCOUNTER — Emergency Department (HOSPITAL_COMMUNITY)
Admission: EM | Admit: 2014-09-14 | Discharge: 2014-09-14 | Disposition: A | Discharge status: Home / Self Care | Payer: Medicaid Other | Attending: Emergency Medicine | Admitting: Emergency Medicine

## 2014-09-14 DIAGNOSIS — Z87440 Personal history of urinary (tract) infections: Secondary | ICD-10-CM | POA: Diagnosis not present

## 2014-09-14 DIAGNOSIS — Z8614 Personal history of Methicillin resistant Staphylococcus aureus infection: Secondary | ICD-10-CM | POA: Insufficient documentation

## 2014-09-14 DIAGNOSIS — R1110 Vomiting, unspecified: Secondary | ICD-10-CM

## 2014-09-14 DIAGNOSIS — R112 Nausea with vomiting, unspecified: Secondary | ICD-10-CM | POA: Diagnosis not present

## 2014-09-14 DIAGNOSIS — R1013 Epigastric pain: Secondary | ICD-10-CM | POA: Diagnosis not present

## 2014-09-14 LAB — COMPREHENSIVE METABOLIC PANEL
ALBUMIN: 4 g/dL (ref 3.5–5.0)
ALT: 141 U/L — ABNORMAL HIGH (ref 14–54)
AST: 153 U/L — AB (ref 15–41)
Alkaline Phosphatase: 96 U/L (ref 38–126)
Anion gap: 7 (ref 5–15)
BILIRUBIN TOTAL: 0.4 mg/dL (ref 0.3–1.2)
BUN: 15 mg/dL (ref 6–20)
CO2: 24 mmol/L (ref 22–32)
Calcium: 9.2 mg/dL (ref 8.9–10.3)
Chloride: 110 mmol/L (ref 101–111)
Creatinine, Ser: 0.63 mg/dL (ref 0.44–1.00)
GLUCOSE: 89 mg/dL (ref 70–99)
Potassium: 4.1 mmol/L (ref 3.5–5.1)
SODIUM: 141 mmol/L (ref 135–145)
TOTAL PROTEIN: 7.7 g/dL (ref 6.5–8.1)

## 2014-09-14 LAB — CBC WITH DIFFERENTIAL/PLATELET
Basophils Absolute: 0 10*3/uL (ref 0.0–0.1)
Basophils Relative: 0 % (ref 0–1)
Eosinophils Absolute: 0 10*3/uL (ref 0.0–0.7)
Eosinophils Relative: 0 % (ref 0–5)
HCT: 40.8 % (ref 36.0–46.0)
HEMOGLOBIN: 12.8 g/dL (ref 12.0–15.0)
LYMPHS ABS: 1 10*3/uL (ref 0.7–4.0)
LYMPHS PCT: 10 % — AB (ref 12–46)
MCH: 27.2 pg (ref 26.0–34.0)
MCHC: 31.4 g/dL (ref 30.0–36.0)
MCV: 86.6 fL (ref 78.0–100.0)
Monocytes Absolute: 0.8 10*3/uL (ref 0.1–1.0)
Monocytes Relative: 8 % (ref 3–12)
NEUTROS PCT: 82 % — AB (ref 43–77)
Neutro Abs: 8.2 10*3/uL — ABNORMAL HIGH (ref 1.7–7.7)
Platelets: 318 10*3/uL (ref 150–400)
RBC: 4.71 MIL/uL (ref 3.87–5.11)
RDW: 13.6 % (ref 11.5–15.5)
WBC: 10 10*3/uL (ref 4.0–10.5)

## 2014-09-14 LAB — LIPASE, BLOOD: Lipase: 23 U/L (ref 22–51)

## 2014-09-14 LAB — I-STAT BETA HCG BLOOD, ED (MC, WL, AP ONLY)

## 2014-09-14 MED ORDER — ONDANSETRON HCL 4 MG PO TABS: 4.0000 mg | ORAL_TABLET | Freq: Three times a day (TID) | ORAL | Status: DC | PRN

## 2014-09-14 MED ORDER — SODIUM CHLORIDE 0.9 % IV BOLUS (SEPSIS)
1000.0000 mL | Freq: Once | INTRAVENOUS | Status: AC
  Administered 2014-09-14: 1000 mL via INTRAVENOUS

## 2014-09-14 MED ORDER — ONDANSETRON HCL 4 MG/2ML IJ SOLN
4.0000 mg | Freq: Once | INTRAMUSCULAR | Status: AC
  Administered 2014-09-14: 4 mg via INTRAVENOUS
  Filled 2014-09-14: qty 2

## 2014-09-14 NOTE — Discharge Instructions (Signed)
Return to the emergency room for severely worsening abdominal pain, abdominal pain that localizes to a particular area (especially the right upper or lower part of the belly), pain that persists past 8-10 hours, blood in stool or vomit, severe weakness, fainting, or fever.   Maintain hydration by drinking small amounts of clear fluids frequently, then soft diet, and then advance to a solid diet as tolerated. Avoid foods that are spicy, high in fat or dairy.  Do not hesitate to return to the emergency room for any new, worsening or concerning symptoms.  Please obtain primary care using resource guide below. Let them know that you were seen in the emergency room and that they will need to obtain records for further outpatient management.     Emergency Department Resource Guide 1) Find a Doctor and Pay Out of Pocket Although you won't have to find out who is covered by your insurance plan, it is a good idea to ask around and get recommendations. You will then need to call the office and see if the doctor you have chosen will accept you as a new patient and what types of options they offer for patients who are self-pay. Some doctors offer discounts or will set up payment plans for their patients who do not have insurance, but you will need to ask so you aren't surprised when you get to your appointment.  2) Contact Your Local Health Department Not all health departments have doctors that can see patients for sick visits, but many do, so it is worth a call to see if yours does. If you don't know where your local health department is, you can check in your phone book. The CDC also has a tool to help you locate your state's health department, and many state websites also have listings of all of their local health departments.  3) Find a Walk-in Clinic If your illness is not likely to be very severe or complicated, you may want to try a walk in clinic. These are popping up all over the country in  pharmacies, drugstores, and shopping centers. They're usually staffed by nurse practitioners or physician assistants that have been trained to treat common illnesses and complaints. They're usually fairly quick and inexpensive. However, if you have serious medical issues or chronic medical problems, these are probably not your best option.  No Primary Care Doctor: - Call Health Connect at  [phone] - they can help you locate a primary care doctor that  accepts your insurance, provides certain services, etc. - Physician Referral Service- [phone]  Chronic Pain Problems: Organization         Address  Phone   Notes  [facility]  [phone] Patients need to be referred by their primary care doctor.   Medication Assistance: Organization         Address  Phone   Notes  [facility] [address] [place], [place] [id number] [phone] --Must be a resident of [facility] -- Must have NO insurance coverage whatsoever (no Medicaid/ Medicare, etc.) -- The pt. MUST have a primary care doctor that directs their care regularly and follows them in the community   Med[facility]ist  [phone]   [employer]  [phone]    Agencies that provide inexpensive medical care: Organization         Address  Phone   Notes  [facility]  [phone]   [facility]    [phone]   [facility] [place], [place] [address] [phone]   [place] [address] [place] [phone]   Planned Parenthood    [phone]   [facility]    [phone]   [place] and [place] [address], [place] Phone:  [phone], Fax:  [phone] Hours of Operation:  9 am - 6 pm, M-F.  Also accepts Medicaid/Medicare and self-pay.  [facility] for [place] [address], [address],  [place] Phone: [phone], Fax: [phone]. Hours of Operation:  8:30 am - 5:30 pm, M-F.  Also accepts Medicaid and self-pay.  [facility] [place] [address], [place] Phone: [phone]   [place], [place], [place] [phone], Ext. [phone] Mondays & Thursdays: 7-9 AM.  First 15 patients are seen on a first come, first serve basis.    [place] Providers:  Organization         Address  Phone   Notes  [facility] [address], [place] [phone] Also accepts self-pay patients.  [facility] [address], [place]  [phone]   [place], [address], [place] [phone]   [facility] [address] [place] [phone]   Lucianne Lei [address], [place]   [phone] Only accepts [place] Access [place] patients after they have their name applied to their card.   Self-Pay (no insurance) in [facility]:  Organization         Address  Phone   Notes  Sickle Cell Patients, [facility] [place] [phone]   [facility] [place] [phone]   [facility] [place]  [place], [place], [place] [phone]   [facility]/Dr. Osei-Bonsu  [address] [place] or [address], [place] [phone] Phone number for both [place] and [place] locations is the same.  [facility] [address], [place] [phone]   [facility] [address], [place] or [address] [phone]   [facility] [address] [place] [phone], phone; [phone], fax Sees patients 1st and 3rd Saturday of every month.  Must not  qualify for public or private insurance (i.e. Medicaid, Medicare, [place] Health Choice, Veterans' Benefits)  Household income should be no more than 200% of the poverty level The clinic cannot treat you if you are pregnant or think you are pregnant  Sexually transmitted diseases are not treated at the clinic.    Dental Care: Organization         Address  Phone  Notes  [facility] [employer] [phone] Accepts children up to age 65 who are enrolled in [place] or [place]; pregnant women with a Medicaid card; and children who have applied for Medicaid or [place] Health Choice, but were declined, whose parents can pay a reduced fee at time of service.  [facility]  [address], [employer] [phone])  [phone] Accepts children up to age 43 who are enrolled in Medicaid or [place] Health Choice; pregnant women with a Medicaid card; and children who have applied for Medicaid or [place] Health Choice, but were declined, whose parents can pay a reduced fee at time of service.  [place] Adult [facility] Access PRO[facility]  [place] [phone] Patients are seen by appointment only. Walk-ins are not accepted. [place] will see patients 84 years of age and older. Monday - Tuesday (8am-5pm) Most Wednesdays (8:30-5pm) $30 per visit, cash only  [facility] Adult Dental Access PROGRAM  [address], [facility] [phone] Patients are seen by appointment only. Walk-ins are not accepted. [place] will see patients 65 years of age and older. One Wednesday Evening (Monthly: Volunteer Based).  $30 per visit, cash only  [place]  [phone] for adults; Children under age 57, call Graduate Pediatric Dentistry at [phone]. Children aged 75-14, please call [phone] to request a pediatric application.  Dental services are provided  in all areas of dental care including fillings, crowns and bridges, complete and partial dentures, implants, gum treatment, root canals, and extractions. Preventive care is also provided. Treatment is provided to both adults and children. Patients are selected via a lottery and there is often a waiting list.   [facility] [address] [place]  [phone] [email or web address]   [facility] [address] [place], [place] [phone], Ext. [phone] Second and Fourth Thursday of each month, opens at 6:30 AM; Clinic ends at 9 AM.  Patients are seen on a first-come first-served basis, and a limited number are seen during each clinic.   [facility]  [address] Hillard Danker [place], [place] [phone]   Eligibility Requirements You must have lived in [place], [place], or [place] counties for at least the last three months.   You cannot be eligible for state or federal sponsored [employer], including [employer], [place], or [employer].   You generally cannot be eligible for healthcare insurance through your employer.    How to apply: Eligibility screenings are held every Tuesday and Wednesday afternoon from 1:00 pm until 4:00 pm. You do not need an appointment for the interview!  [facility] [address] [place], [place]   [place]  [place] Department  [place]  [phone]    Behavioral Health Resources in the Community: Intensive Outpatient Programs Organization         Address  Phone  Notes  [place]. [address] [place], [place] [phone]   [facility] [address], [place], [place]   ADS: Alcohol & Drug Svcs [address], [place], [place]   [place] [address],  [place], [place] or [phone]   Substance Abuse Resources Organization         Address  Phone  Notes  Alcohol and Drug Services  [phone]   [place]  [phone]   [facility] [place]   Chinita Pester  [phone]   Residential & Outpatient Substance Abuse Program  [phone]   Psychological Services Organization         Address  Phone  Notes  [place]  [phone]  [facility] [address], [place] [phone] or [phone]    Mobile Crisis Teams Organization         Address  Phone  Notes  [facility], Mobile Crisis Care Unit  [phone]   [facility]  [address] [place], [place] [phone]   Doristine LocksSharon DeEsch [address] [place] [phone]    Self-Help/Support Groups Organization         Address  Phone             Notes  [facility] [place] - variety of support groups  336- [phone] Call for more information  Narcotics Anonymous (NA), [facility] [address], [place]  2 meetings at this location   Statisticianesidential Treatment Programs Organization         Address  Phone  Notes  [facility] [address],    [place]  [phone]   [facility]  [address], [place] [phone], [place], [place] [phone]   [facility] [address], [place] [phone] Admissions: 8am-3pm M-F  [facility] [address]    [place], [place] [phone]   [facility] [address], [place], [place] [phone]   [facility] [address]  [place], [place] [phone]   [facility] - Intensive Outpatient [address], [place], [place] [phone]   [facility] ([facility] [address]  [place], [place] [phone] or  [phone]   [facility] (RT[facility]) [address] [place], [place] [phone] Accepts Medicaid  [facility] [address]  [place] [phone] Substance Abuse/Addiction Treatment   [facility] [place] [place] Behavioral Health Resources Organization         Address  Phone  Notes  [facility]  [phone]   Angie FavaJulie Brannon, PhD [address], [address] [place], [place]   [phone] or [phone]) [phone]   [facility] [place] [facility]   [address] [place], [place] [phone]   [facility] [address], [place], [place] [phone] Insurance/Medicaid/sponsorship through [facility]  [facility] [address]                                    [place], [place] [phone] Therapy/tele-psych/case  [facility] [address].   [place], [place] [phone]) [phone]    Dr. Lolly MustacheArfeen  [phone]) [phone]   [facility]  [employer] [facility] 1) [address], [place] 2) [address], [place] 3)  [address], [place] [phone]  [phone]   [facility] Child Abuse Hotline [phone] or [phone] (After Hours)

## 2014-09-14 NOTE — ED Notes (Signed)
Pt able to drink without issue

## 2014-09-14 NOTE — ED Provider Notes (Signed)
CSN: [id number]     Arrival date & time 09/14/14  1150 History   First MD Initiated Contact with Patient 09/14/14 1256     Chief Complaint  Patient presents with  . Abdominal Pain  . Cholelithiasis     (Consider location/radiation/quality/duration/timing/severity/associated sxs/prior Treatment) HPI   Madeline [name] is a 24 y.o. female complaining of intermittent, non-postprandial, completely resolved 10 out of 10 epigastric abdominal pain onset this morning associated with 5 episodes of nonbloody, nonbilious, coffee-ground emesis. States she was told she had gallstones in the past, did not follow-up with general surgery. Patient denies fever, chills, any active pain, diarrhea, dysuria, chest pain, shortness of breath, sick contacts.  Past Medical History  Diagnosis Date  . Headache(784.0)     not requiring meds  . Infection     UTI  . Medical history non-contributory   . MRSA infection     reports hx- ? 4 yrs ago   Past Surgical History  Procedure Laterality Date  . No past surgeries     Family History  Problem Relation Age of Onset  . Asthma Brother   . Restless legs syndrome Mother   . Restless legs syndrome Sister   . Heart disease Father   . Stroke Paternal Grandmother    History  Substance Use Topics  . Smoking status: Never Smoker   . Smokeless tobacco: Never Used  . Alcohol Use: No   OB History    Gravida Para Term Preterm AB TAB SAB Ectopic Multiple Living   1 1 1       1      Review of Systems  10 systems reviewed and found to be negative, except as noted in the HPI.   Allergies  Review of patient's allergies indicates no known allergies.  Home Medications   Prior to Admission medications   Medication Sig Start Date End Date Taking? Authorizing Provider  OVER THE COUNTER MEDICATION Therma-Bond (Herbal Life). Take two tablets before an unhealthy meal.   Yes Historical Provider, MD  HYDROcodone-acetaminophen (NORCO/VICODIN) 5-325 MG per tablet Take  1-2 tablets by mouth every 4 (four) hours as needed for moderate pain. Patient not taking: Reported on 09/14/2014 06/02/13   Ivonne AndrewPeter Dammen, PA-C  levonorgestrel (MIRENA) 20 MCG/24HR IUD 1 Intra Uterine Device (1 each total) by Intrauterine route once. Patient not taking: Reported on 09/14/2014 04/11/13   Minta BalsamMichael R Odom, MD  ondansetron (ZOFRAN) 4 MG tablet Take 1 tablet (4 mg total) by mouth every 8 (eight) hours as needed for nausea or vomiting. 09/14/14   Joni ReiningNicole Jareli Highland, PA-C   BP 103/56 mmHg  Pulse 75  Temp(Src) 98.2 F (36.8 C) (Oral)  Resp 18  SpO2 100%  LMP 09/14/2014  Breastfeeding? No Physical Exam  Constitutional: She is oriented to person, place, and time. She appears well-developed and well-nourished. No distress.  HENT:  Head: Normocephalic and atraumatic.  Mouth/Throat: Oropharynx is clear and moist.  Eyes: Conjunctivae and EOM are normal. Pupils are equal, round, and reactive to light.  Neck: Normal range of motion.  Cardiovascular: Normal rate, regular rhythm and intact distal pulses.   Pulmonary/Chest: Effort normal and breath sounds normal. No respiratory distress. She has no wheezes. She has no rales. She exhibits no tenderness.  Abdominal: Soft. Bowel sounds are normal. There is no tenderness.  Hyperactive bowel sounds, very mild tenderness palpation in the epigastrium, Eulah PontMurphy sign is negative, no tenderness palpation over McBurney's point, Rovsing so as an obturator are also negative.  Musculoskeletal: Normal range  of motion. She exhibits no edema or tenderness.      Neurological: She is alert and oriented to person, place, and time.  Skin: She is not diaphoretic.  Psychiatric: She has a normal mood and affect.  Nursing note and vitals reviewed.   ED Course  Procedures (including critical care time) Labs Review Labs Reviewed  CBC WITH DIFFERENTIAL/PLATELET - Abnormal; Notable for the following:    Neutrophils Relative % 82 (*)    Neutro Abs 8.2 (*)     Lymphocytes Relative 10 (*)    All other components within normal limits  COMPREHENSIVE METABOLIC PANEL - Abnormal; Notable for the following:    AST 153 (*)    ALT 141 (*)    All other components within normal limits  LIPASE, BLOOD  I-STAT BETA HCG BLOOD, ED ([facility], [facility], [facility] ONLY)    Imaging Review No results found.   EKG Interpretation None      MDM   Final diagnoses:  Non-intractable vomiting with nausea, vomiting of unspecified type  Epigastric pain    Filed Vitals:   09/14/14 1212 09/14/14 1540  BP: 120/63 103/56  Pulse: 90 75  Temp: 98.2 F (36.8 C)   TempSrc: Oral   Resp: 18 18  SpO2: 100% 100%    Medications  sodium chloride 0.9 % bolus 1,000 mL (0 mLs Intravenous Stopped 09/14/14 1531)  ondansetron (ZOFRAN) injection 4 mg (4 mg Intravenous Given 09/14/14 1338)    Madeline [name] is a pleasant 24 y.o. female presenting with altered abdominal pain and several episodes of emesis this morning. Differential diagnosis includes biliary colic, cholecystitis, gastritis. Patient is tolerating by mouth, patient remains pain-free in the ED. She is mild elevation in her LFTs, no leukocytosis. Serial abdominal exams remain benign with no right upper quadrant tenderness, negative Murphy sign. Like to get a ultrasound the patient is concerned about money. I don't think she has an acute cholecystitis because she is improving and has no pain. She also has no leukocytosis. I've advised the patient that she should return to the emergency room for fever, recurrence of her pain, persistent vomiting. Patient verbalized her understanding. Also going to give her a referral to our wellness Center. Advised her to follow closely with surgery and return precautions were discussed. Patient verbalizes her understanding.  Evaluation does not show pathology that would require ongoing emergent intervention or inpatient treatment. Pt is hemodynamically stable and mentating appropriately. Discussed findings  and plan with patient/guardian, who agrees with care plan. All questions answered. Return precautions discussed and outpatient follow up given.   New Prescriptions   ONDANSETRON (ZOFRAN) 4 MG TABLET    Take 1 tablet (4 mg total) by mouth every 8 (eight) hours as needed for nausea or vomiting.         Wynetta Emeryicole Kolsen Choe, PA-C 09/14/14 1616  Samuel JesterKathleen McManus, DO 09/16/14 1302

## 2014-09-14 NOTE — ED Notes (Signed)
Pt c/o mid upper abdominal pain described as "sharp and shooting." Says, "it comes and goes" and is not particularly related to eating food. Was seen here in December and told she has gall stones. Has not seen a [facility]. Emesis x5 today. Denies diarrhea. No other c/c. No actively vomiting.

## 2015-03-15 ENCOUNTER — Inpatient Hospital Stay (HOSPITAL_COMMUNITY)
Admission: AD | Admit: 2015-03-15 | Discharge: 2015-03-15 | Disposition: A | Discharge status: Home / Self Care | Payer: Medicaid Other | Source: Ambulatory Visit | Attending: Obstetrics and Gynecology | Admitting: Obstetrics and Gynecology

## 2015-03-15 ENCOUNTER — Encounter (HOSPITAL_COMMUNITY): Payer: Self-pay | Admitting: *Deleted

## 2015-03-15 DIAGNOSIS — O26891 Other specified pregnancy related conditions, first trimester: Secondary | ICD-10-CM | POA: Diagnosis not present

## 2015-03-15 DIAGNOSIS — Z3A13 13 weeks gestation of pregnancy: Secondary | ICD-10-CM | POA: Diagnosis not present

## 2015-03-15 DIAGNOSIS — F411 Generalized anxiety disorder: Secondary | ICD-10-CM | POA: Diagnosis not present

## 2015-03-15 DIAGNOSIS — R079 Chest pain, unspecified: Secondary | ICD-10-CM | POA: Diagnosis present

## 2015-03-15 DIAGNOSIS — R0789 Other chest pain: Secondary | ICD-10-CM

## 2015-03-15 HISTORY — DX: Calculus of gallbladder without cholecystitis without obstruction: K80.20

## 2015-03-15 LAB — URINALYSIS, ROUTINE W REFLEX MICROSCOPIC
Bilirubin Urine: NEGATIVE
Glucose, UA: NEGATIVE mg/dL
KETONES UR: NEGATIVE mg/dL
NITRITE: NEGATIVE
Protein, ur: NEGATIVE mg/dL
Specific Gravity, Urine: 1.02 (ref 1.005–1.030)
UROBILINOGEN UA: 1 mg/dL (ref 0.0–1.0)
pH: 6 (ref 5.0–8.0)

## 2015-03-15 LAB — URINE MICROSCOPIC-ADD ON

## 2015-03-15 LAB — POCT PREGNANCY, URINE: Preg Test, Ur: POSITIVE — AB

## 2015-03-15 MED ORDER — GI COCKTAIL ~~LOC~~
30.0000 mL | Freq: Once | ORAL | Status: AC
  Administered 2015-03-15: 30 mL via ORAL
  Filled 2015-03-15: qty 30
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
## 2015-03-15 NOTE — MAU Provider Note (Signed)
History   G2P1001 @ 13 wks in with c/o chest tightening since yesterday. Denies chest pain just feeling of tightness.  CSN: [id number]  Arrival date & time 03/15/15  1616   None     Chief Complaint  Patient presents with  . Chest Pain    HPI  Past Medical History  Diagnosis Date  . Headache(784.0)     not requiring meds  . Infection     UTI  . Medical history non-contributory   . MRSA infection     reports hx- ? 4 yrs ago  . Gall stones     Past Surgical History  Procedure Laterality Date  . No past surgeries      Family History  Problem Relation Age of Onset  . Asthma Brother   . Restless legs syndrome Mother   . Restless legs syndrome Sister   . Heart disease Father   . Stroke Paternal Grandmother     Social History  Substance Use Topics  . Smoking status: Never Smoker   . Smokeless tobacco: Never Used  . Alcohol Use: No    OB History    Gravida Para Term Preterm AB TAB SAB Ectopic Multiple Living   2 1 1       1       Review of Systems  Constitutional: Negative.   HENT: Negative.   Eyes: Negative.   Respiratory: Positive for chest tightness.   Cardiovascular: Negative.   Gastrointestinal: Negative.   Endocrine: Negative.   Genitourinary: Negative.   Musculoskeletal: Negative.   Skin: Negative.   Neurological: Negative.   Hematological: Negative.   Psychiatric/Behavioral: Negative.     Allergies  Review of patient's allergies indicates no known allergies.  Home Medications  No current outpatient prescriptions on file.  BP 102/65 mmHg  Pulse 96  Temp(Src) 98.7 F (37.1 C) (Oral)  Resp 16  SpO2 100%  Physical Exam  Constitutional: She is oriented to person, place, and time. She appears well-developed and well-nourished.  HENT:  Head: Normocephalic.  Eyes: Pupils are equal, round, and reactive to light.  Neck: Normal range of motion.  Cardiovascular: Normal rate, regular rhythm, normal heart sounds and intact distal pulses.    Pulmonary/Chest: Effort normal and breath sounds normal.  Abdominal: Soft.  Genitourinary: Vagina normal and uterus normal.  Musculoskeletal: Normal range of motion.  Neurological: She is alert and oriented to person, place, and time. She has normal reflexes.  Skin: Skin is warm and dry.  Psychiatric: She has a normal mood and affect. Her behavior is normal. Judgment and thought content normal.    MAU Course  Procedures (including critical care time)  Labs Reviewed  POCT PREGNANCY, URINE - Abnormal; Notable for the following:    Preg Test, Ur POSITIVE (*)    All other components within normal limits  URINALYSIS, ROUTINE W REFLEX MICROSCOPIC (NOT AT [facility])   No results found.   No diagnosis found.    MDM  GI cocktail, EKG. FHR 155 st and reg with doppler. EKG normal. No distress. Will d/c home..Marland Kitchen

## 2015-03-15 NOTE — Discharge Instructions (Signed)

## 2015-03-19 ENCOUNTER — Inpatient Hospital Stay (HOSPITAL_COMMUNITY)
Admission: AD | Admit: 2015-03-19 | Discharge: 2015-03-19 | Disposition: A | Discharge status: Home / Self Care | Payer: Medicaid Other | Source: Ambulatory Visit | Attending: Obstetrics and Gynecology | Admitting: Obstetrics and Gynecology

## 2015-03-19 ENCOUNTER — Encounter (HOSPITAL_COMMUNITY): Payer: Self-pay | Admitting: *Deleted

## 2015-03-19 DIAGNOSIS — Z3A14 14 weeks gestation of pregnancy: Secondary | ICD-10-CM | POA: Insufficient documentation

## 2015-03-19 DIAGNOSIS — B3731 Acute candidiasis of vulva and vagina: Secondary | ICD-10-CM

## 2015-03-19 DIAGNOSIS — B373 Candidiasis of vulva and vagina: Secondary | ICD-10-CM

## 2015-03-19 DIAGNOSIS — O98812 Other maternal infectious and parasitic diseases complicating pregnancy, second trimester: Secondary | ICD-10-CM | POA: Diagnosis not present

## 2015-03-19 DIAGNOSIS — N898 Other specified noninflammatory disorders of vagina: Secondary | ICD-10-CM | POA: Diagnosis present

## 2015-03-19 LAB — URINALYSIS, ROUTINE W REFLEX MICROSCOPIC
BILIRUBIN URINE: NEGATIVE
GLUCOSE, UA: NEGATIVE mg/dL
Hgb urine dipstick: NEGATIVE
Ketones, ur: NEGATIVE mg/dL
Nitrite: NEGATIVE
PH: 7.5 (ref 5.0–8.0)
Protein, ur: NEGATIVE mg/dL
Specific Gravity, Urine: 1.015 (ref 1.005–1.030)
Urobilinogen, UA: 1 mg/dL (ref 0.0–1.0)

## 2015-03-19 LAB — WET PREP, GENITAL
Clue Cells Wet Prep HPF POC: NONE SEEN
TRICH WET PREP: NONE SEEN

## 2015-03-19 LAB — URINE MICROSCOPIC-ADD ON

## 2015-03-19 MED ORDER — TERCONAZOLE 0.4 % VA CREA: 1.0000 | TOPICAL_CREAM | Freq: Every day | VAGINAL | Status: DC

## 2015-03-19 NOTE — Discharge Instructions (Signed)
Vaginitis °Vaginitis is an inflammation of the vagina. It is most often caused by a change in the normal balance of the bacteria and yeast that live in the vagina. This change in balance causes an overgrowth of certain bacteria or yeast, which causes the inflammation. There are different types of vaginitis, but the most common types are: °· Bacterial vaginosis. °· Yeast infection (candidiasis). °· Trichomoniasis vaginitis. This is a sexually transmitted infection (STI). °· Viral vaginitis. °· Atrophic vaginitis. °· Allergic vaginitis. °CAUSES  °The cause depends on the type of vaginitis. Vaginitis can be caused by: °· Bacteria (bacterial vaginosis). °· Yeast (yeast infection). °· A parasite (trichomoniasis vaginitis) °· A virus (viral vaginitis). °· Low hormone levels (atrophic vaginitis). Low hormone levels can occur during pregnancy, breastfeeding, or after menopause. °· Irritants, such as bubble baths, scented tampons, and feminine sprays (allergic vaginitis). °Other factors can change the normal balance of the yeast and bacteria that live in the vagina. These include: °· Antibiotic medicines. °· Poor hygiene. °· Diaphragms, vaginal sponges, spermicides, birth control pills, and intrauterine devices (IUD). °· Sexual intercourse. °· Infection. °· Uncontrolled diabetes. °· A weakened immune system. °SYMPTOMS  °Symptoms can vary depending on the cause of the vaginitis. Common symptoms include: °· Abnormal vaginal discharge. °· The discharge is white, gray, or yellow with bacterial vaginosis. °· The discharge is thick, white, and cheesy with a yeast infection. °· The discharge is frothy and yellow or greenish with trichomoniasis. °· A bad vaginal odor. °· The odor is fishy with bacterial vaginosis. °· Vaginal itching, pain, or swelling. °· Painful intercourse. °· Pain or burning when urinating. °Sometimes, there are no symptoms. °TREATMENT  °Treatment will vary depending on the type of infection.  °· Bacterial  vaginosis and trichomoniasis are often treated with antibiotic creams or pills. °· Yeast infections are often treated with antifungal medicines, such as vaginal creams or suppositories. °· Viral vaginitis has no cure, but symptoms can be treated with medicines that relieve discomfort. Your sexual partner should be treated as well. °· Atrophic vaginitis may be treated with an estrogen cream, pill, suppository, or vaginal ring. If vaginal dryness occurs, lubricants and moisturizing creams may help. You may be told to avoid scented soaps, sprays, or douches. °· Allergic vaginitis treatment involves quitting the use of the product that is causing the problem. Vaginal creams can be used to treat the symptoms. °HOME CARE INSTRUCTIONS  °· Take all medicines as directed by your caregiver. °· Keep your genital area clean and dry. Avoid soap and only rinse the area with water. °· Avoid douching. It can remove the healthy bacteria in the vagina. °· Do not use tampons or have sexual intercourse until your vaginitis has been treated. Use sanitary pads while you have vaginitis. °· Wipe from front to back. This avoids the spread of bacteria from the rectum to the vagina. °· Let air reach your genital area. °¨ Wear cotton underwear to decrease moisture buildup. °¨ Avoid wearing underwear while you sleep until your vaginitis is gone. °¨ Avoid tight pants and underwear or nylons without a cotton panel. °¨ Take off wet clothing (especially bathing suits) as soon as possible. °· Use mild, non-scented products. Avoid using irritants, such as: °¨ Scented feminine sprays. °¨ Fabric softeners. °¨ Scented detergents. °¨ Scented tampons. °¨ Scented soaps or bubble baths. °· Practice safe sex and use condoms. Condoms may prevent the spread of trichomoniasis and viral vaginitis. °SEEK MEDICAL CARE IF:  °· You have abdominal pain. °· You   have a fever or persistent symptoms for more than 2-3 days. °· You have a fever and your symptoms suddenly  get worse. °  °This information is not intended to replace advice given to you by your health care provider. Make sure you discuss any questions you have with your health care provider. °  °Document Released: 02/19/2007 Document Revised: 09/08/2014 Document Reviewed: 10/05/2011 °Elsevier Interactive Patient Education ©2016 Else[employer] Inc. °Probiotics °WHAT ARE PROBIOTICS? °Probiotics are the good bacteria and yeasts that live in your body and keep you and your digestive system healthy. Probiotics also help your body's defense (immune) system and protect your body against bad bacterial growth.  °Certain foods contain probiotics, such as yogurt. Probiotics can also be purchased as a supplement. As with any supplement or drug, it is important to discuss its use with your health care provider.  °WHAT AFFECTS THE BALANCE OF BACTERIA IN MY BODY? °The balance of bacteria in your body can be affected by:  °· Antibiotic medicines. Antibiotics are sometimes necessary to treat infection. Unfortunately, they may kill good or friendly bacteria in your body as well as the bad bacteria. This may lead to stomach problems like diarrhea, gas, and cramping. °· Disease. Some conditions are the result of an overgrowth of bad bacteria, yeasts, parasites, or fungi. These conditions include:   °¨ Infectious diarrhea. °¨ Stomach and respiratory infections. °¨ Skin infections. °¨ Irritable bowel syndrome (IBS). °¨ Inflammatory bowel diseases. °¨ Ulcer due to Helicobacter pylori (H. pylori) infection. °¨ Tooth decay and periodontal disease. °¨ Vaginal infections. °Stress and poor diet may also lower the good bacteria in your body.  °WHAT TYPE OF PROBIOTIC IS RIGHT FOR ME? °Probiotics are available over the counter at your local pharmacy, health food, or grocery store. They come in many different forms, combinations of strains, and dosing strengths. Some may need to be refrigerated. Always read the label for storage and usage  instructions. °Specific strains have been shown to be more effective for certain conditions. Ask your health care provider what option is best for you.  °WHY WOULD I NEED PROBIOTICS? °There are many reasons your health care provider might recommend a probiotic supplement, including:  °· Diarrhea. °· Constipation. °· IBS. °· Respiratory infections. °· Yeast infections. °· Acne, eczema, and other skin conditions. °· Frequent urinary tract infections (UTIs). °ARE THERE SIDE EFFECTS OF PROBIOTICS? °Some people experience mild side effects when taking probiotics. Side effects are usually temporary and may include:  °· Gas. °· Bloating. °· Cramping. °Rarely, serious side effects, such as infection or immune system changes, may occur. °WHAT ELSE DO I NEED TO KNOW ABOUT PROBIOTICS?  °· There are many different strains of probiotics. Certain strains may be more effective depending on your condition. Probiotics are available in varying doses. Ask your health care provider which probiotic you should use and how often.   °· If you are taking probiotics along with antibiotics, it is generally recommended to wait at least 2 hours between taking the antibiotic and taking the probiotic.   °FOR MORE INFORMATION:  °National Center for Complementary and Alternative Medicine http://nccam.nih.gov/ °  °This information is not intended to replace advice given to you by your health care provider. Make sure you discuss any questions you have with your health care provider. °  °Document Released: 11/19/2013 Document Reviewed: 11/19/2013 °Elsevier Interactive Patient Education ©2016 Elsevier Inc. ° °

## 2015-03-19 NOTE — MAU Note (Addendum)
Vag d/c and irritation/itching, off an on past 2 months.  Had used monistat when not preg, called provider, would not give phone advice as she has not been seen this preg.

## 2015-03-19 NOTE — MAU Provider Note (Signed)
History     CSN: [id number]  Arrival date and time: 03/19/15 1140   First Provider Initiated Contact with Patient 03/19/15 1217      Chief Complaint  Patient presents with  . Vaginal Discharge   HPI   Ms.Madeline [name] is a 24 y.o. female G2P1001 at 4440w0d presenting to MAU with symptoms of a yeast infection.  She has not tried anything over the counter. Symptoms consist of white/yellow discharge, itching, and odor. The symptoms started a few weeks ago. She has tried monistat over the counter for 1 treatment only.   [facility]  OB History    Gravida Para Term Preterm AB TAB SAB Ectopic Multiple Living   2 1 1       1       Past Medical History  Diagnosis Date  . Headache(784.0)     not requiring meds  . Infection     UTI  . MRSA infection     reports hx- ? 4 yrs ago  . Gall stones     Past Surgical History  Procedure Laterality Date  . No past surgeries      Family History  Problem Relation Age of Onset  . Asthma Brother   . Restless legs syndrome Mother   . Restless legs syndrome Sister   . Heart disease Father   . Stroke Paternal Grandmother     Social History  Substance Use Topics  . Smoking status: Never Smoker   . Smokeless tobacco: Never Used  . Alcohol Use: No    Allergies: No Known Allergies  Prescriptions prior to admission  Medication Sig Dispense Refill Last Dose  . Acetaminophen (TYLENOL PO) Take 2 tablets by mouth every 6 (six) hours as needed (headache).   Past Month at Unknown time  . Prenatal Vit-Fe Fumarate-FA (PRENATAL MULTIVITAMIN) TABS tablet Take 1 tablet by mouth daily at 12 noon.   Past Week at Unknown time  . HYDROcodone-acetaminophen (NORCO/VICODIN) 5-325 MG per tablet Take 1-2 tablets by mouth every 4 (four) hours as needed for moderate pain. (Patient not taking: Reported on 03/19/2015) 20 tablet 0 Not Taking at Unknown time  . ondansetron (ZOFRAN) 4 MG tablet Take 1 tablet (4 mg  total) by mouth every 8 (eight) hours as needed for nausea or vomiting. (Patient not taking: Reported on 03/19/2015) 5 tablet 0 Not Taking at Unknown time   Results for orders placed or performed during the hospital encounter of 03/19/15 (from the past 48 hour(s))  Urinalysis, Routine w reflex microscopic (not at [facility])     Status: Abnormal   Collection Time: 03/19/15 11:45 AM  Result Value Ref Range   Color, Urine YELLOW YELLOW   APPearance CLOUDY (A) CLEAR   Specific Gravity, Urine 1.015 1.005 - 1.030   pH 7.5 5.0 - 8.0   Glucose, UA NEGATIVE NEGATIVE mg/dL   Hgb urine dipstick NEGATIVE NEGATIVE   Bilirubin Urine NEGATIVE NEGATIVE   Ketones, ur NEGATIVE NEGATIVE mg/dL   Protein, ur NEGATIVE NEGATIVE mg/dL   Urobilinogen, UA 1.0 0.0 - 1.0 mg/dL   Nitrite NEGATIVE NEGATIVE   Leukocytes, UA TRACE (A) NEGATIVE  Urine microscopic-add on     Status: Abnormal   Collection Time: 03/19/15 11:45 AM  Result Value Ref Range   Squamous Epithelial / LPF FEW (A) RARE   WBC, UA 3-6 <3 WBC/hpf   Bacteria, UA FEW (A) RARE  Wet prep, genital     Status: Abnormal  Collection Time: 03/19/15 12:30 PM  Result Value Ref Range   Yeast Wet Prep HPF POC FEW (A) NONE SEEN   Trich, Wet Prep NONE SEEN NONE SEEN   Clue Cells Wet Prep HPF POC NONE SEEN NONE SEEN   WBC, Wet Prep HPF POC MODERATE (A) NONE SEEN    Comment: MODERATE BACTERIA SEEN     Review of Systems  Constitutional: Negative for fever.  Gastrointestinal: Negative for abdominal pain.   Physical Exam   Blood pressure 111/61, pulse 93, temperature 98.1 F (36.7 C), temperature source Oral, resp. rate 18, last menstrual period 12/11/2014.  Physical Exam  Constitutional: She is oriented to person, place, and time. She appears well-developed and well-nourished. No distress.  HENT:  Head: Normocephalic.  Eyes: Pupils are equal, round, and reactive to light.  Neck: Neck supple.  Respiratory: Effort normal.  GI: Soft. There is no  tenderness.  Genitourinary:  Wet prep collected without speculum   Musculoskeletal: Normal range of motion.  Neurological: She is alert and oriented to person, place, and time.  Skin: Skin is warm. She is not diaphoretic.  Psychiatric: Her behavior is normal.    MAU Course  Procedures  None  MDM Discussed HPI< labs< and fetal heart tones with Dr. Tenny Craw   Assessment and Plan   A:  1. Yeast vaginitis    P:  Discharge home in stable condition RX: Terazol 7 day Return to MAU for emergencies only Follow up with OB as scheduled    Duane Lope, NP 03/19/2015 12:21 PM

## 2015-03-19 NOTE — MAU Note (Signed)
Urine sent to lab 

## 2015-04-06 ENCOUNTER — Other Ambulatory Visit: Payer: Self-pay | Admitting: Obstetrics and Gynecology

## 2015-04-06 LAB — OB RESULTS CONSOLE HIV ANTIBODY (ROUTINE TESTING): HIV: NONREACTIVE

## 2015-04-06 LAB — OB RESULTS CONSOLE ABO/RH: RH TYPE: POSITIVE

## 2015-04-06 LAB — OB RESULTS CONSOLE ANTIBODY SCREEN: ANTIBODY SCREEN: NEGATIVE

## 2015-04-06 LAB — OB RESULTS CONSOLE GC/CHLAMYDIA
Chlamydia: NEGATIVE
Gonorrhea: NEGATIVE

## 2015-04-06 LAB — OB RESULTS CONSOLE HEPATITIS B SURFACE ANTIGEN: HEP B S AG: NEGATIVE

## 2015-04-06 LAB — OB RESULTS CONSOLE RPR: RPR: NONREACTIVE

## 2015-04-06 LAB — OB RESULTS CONSOLE RUBELLA ANTIBODY, IGM: Rubella: NON-IMMUNE/NOT IMMUNE

## 2015-04-07 LAB — CYTOLOGY - PAP

## 2015-05-01 ENCOUNTER — Encounter (HOSPITAL_COMMUNITY): Payer: Self-pay | Admitting: *Deleted

## 2015-05-01 ENCOUNTER — Inpatient Hospital Stay (HOSPITAL_COMMUNITY)
Admission: AD | Admit: 2015-05-01 | Discharge: 2015-05-02 | DRG: 781 | Disposition: A | Discharge status: Home / Self Care | Payer: Medicaid Other | Source: Ambulatory Visit | Attending: Obstetrics and Gynecology | Admitting: Obstetrics and Gynecology

## 2015-05-01 DIAGNOSIS — O2302 Infections of kidney in pregnancy, second trimester: Secondary | ICD-10-CM | POA: Diagnosis present

## 2015-05-01 DIAGNOSIS — Z3A20 20 weeks gestation of pregnancy: Secondary | ICD-10-CM

## 2015-05-01 DIAGNOSIS — O212 Late vomiting of pregnancy: Secondary | ICD-10-CM | POA: Diagnosis present

## 2015-05-01 LAB — CBC WITH DIFFERENTIAL/PLATELET
Basophils Absolute: 0 10*3/uL (ref 0.0–0.1)
Basophils Relative: 0 %
EOS ABS: 0 10*3/uL (ref 0.0–0.7)
EOS PCT: 0 %
HCT: 32.8 % — ABNORMAL LOW (ref 36.0–46.0)
HEMOGLOBIN: 11 g/dL — AB (ref 12.0–15.0)
Lymphocytes Relative: 10 %
Lymphs Abs: 1.1 10*3/uL (ref 0.7–4.0)
MCH: 29.1 pg (ref 26.0–34.0)
MCHC: 33.5 g/dL (ref 30.0–36.0)
MCV: 86.8 fL (ref 78.0–100.0)
MONOS PCT: 8 %
Monocytes Absolute: 0.9 10*3/uL (ref 0.1–1.0)
NEUTROS PCT: 82 %
Neutro Abs: 9 10*3/uL — ABNORMAL HIGH (ref 1.7–7.7)
Platelets: 259 10*3/uL (ref 150–400)
RBC: 3.78 MIL/uL — ABNORMAL LOW (ref 3.87–5.11)
RDW: 13.9 % (ref 11.5–15.5)
WBC: 11 10*3/uL — ABNORMAL HIGH (ref 4.0–10.5)

## 2015-05-01 LAB — URINALYSIS, ROUTINE W REFLEX MICROSCOPIC
BILIRUBIN URINE: NEGATIVE
Glucose, UA: NEGATIVE mg/dL
KETONES UR: 15 mg/dL — AB
Nitrite: POSITIVE — AB
PH: 7 (ref 5.0–8.0)
Protein, ur: NEGATIVE mg/dL
Specific Gravity, Urine: 1.01 (ref 1.005–1.030)

## 2015-05-01 LAB — TYPE AND SCREEN
ABO/RH(D): O POS
Antibody Screen: NEGATIVE

## 2015-05-01 LAB — URINE MICROSCOPIC-ADD ON

## 2015-05-01 MED ORDER — LACTATED RINGERS IV BOLUS (SEPSIS)
1000.0000 mL | Freq: Once | INTRAVENOUS | Status: AC
  Administered 2015-05-01: 1000 mL via INTRAVENOUS

## 2015-05-01 MED ORDER — PROMETHAZINE HCL 25 MG PO TABS: 25.0000 mg | ORAL_TABLET | Freq: Four times a day (QID) | ORAL | Status: DC | PRN

## 2015-05-01 MED ORDER — LACTATED RINGERS IV SOLN
INTRAVENOUS | Status: DC
Start: 2015-05-01 — End: 2015-05-01

## 2015-05-01 MED ORDER — ONDANSETRON HCL 4 MG/2ML IJ SOLN: 4.0000 mg | Freq: Four times a day (QID) | INTRAMUSCULAR | Status: DC | PRN

## 2015-05-01 MED ORDER — ONDANSETRON HCL 4 MG/2ML IJ SOLN
4.0000 mg | Freq: Four times a day (QID) | INTRAMUSCULAR | Status: DC | PRN
  Administered 2015-05-01 – 2015-05-02 (×2): 4 mg via INTRAVENOUS
  Filled 2015-05-01 (×2): qty 2

## 2015-05-01 MED ORDER — DOCUSATE SODIUM 100 MG PO CAPS
100.0000 mg | ORAL_CAPSULE | Freq: Every day | ORAL | Status: DC
  Administered 2015-05-02: 100 mg via ORAL
  Filled 2015-05-01: qty 1

## 2015-05-01 MED ORDER — DEXTROSE 5 % IV SOLN
2.0000 g | Freq: Once | INTRAVENOUS | Status: AC
  Administered 2015-05-01: 2 g via INTRAVENOUS
  Filled 2015-05-01: qty 2

## 2015-05-01 MED ORDER — ZOLPIDEM TARTRATE 5 MG PO TABS
5.0000 mg | ORAL_TABLET | Freq: Every evening | ORAL | Status: DC | PRN
  Administered 2015-05-02: 5 mg via ORAL
  Filled 2015-05-01: qty 1

## 2015-05-01 MED ORDER — ACETAMINOPHEN 325 MG PO TABS
650.0000 mg | ORAL_TABLET | ORAL | Status: DC | PRN
  Administered 2015-05-01 – 2015-05-02 (×5): 650 mg via ORAL
  Filled 2015-05-01 (×5): qty 2

## 2015-05-01 MED ORDER — ACETAMINOPHEN 325 MG PO TABS
650.0000 mg | ORAL_TABLET | Freq: Once | ORAL | Status: AC
  Administered 2015-05-01: 650 mg via ORAL
  Filled 2015-05-01: qty 2

## 2015-05-01 MED ORDER — SODIUM CHLORIDE 0.9 % IV SOLN
INTRAVENOUS | Status: DC
  Administered 2015-05-01 – 2015-05-02 (×3): via INTRAVENOUS

## 2015-05-01 MED ORDER — CALCIUM CARBONATE ANTACID 500 MG PO CHEW
2.0000 | CHEWABLE_TABLET | ORAL | Status: DC | PRN
  Administered 2015-05-02: 400 mg via ORAL
  Filled 2015-05-01: qty 2

## 2015-05-01 MED ORDER — PRENATAL MULTIVITAMIN CH: 1.0000 | ORAL_TABLET | Freq: Every day | ORAL | Status: DC

## 2015-05-01 MED ORDER — ONDANSETRON HCL 4 MG PO TABS: 4.0000 mg | ORAL_TABLET | Freq: Three times a day (TID) | ORAL | Status: DC | PRN

## 2015-05-01 NOTE — MAU Note (Signed)
Pt states she started having symptoms yesterday around 1030.  Pt states she has a fever and is vomiting.  Pt states she has not been around anyone else who is sick.

## 2015-05-01 NOTE — H&P (Signed)
Madeline SenterJessica [name] is a 24 y.o. female presenting for nausea/vomiting and fever  24 yo G2P1001 @ 20+1 presents to MAU for nausea and vomiting and fever. The patient denies flank pain or CVA tenderness. Initially it was felt that her symptoms were likely due to viral gastroenteritis that has been common in the community. However, her urinalysis was positive for nitrates. Therefore, the decision was made to admit the patient to antenatal for IV antibiotics.  History OB History    Gravida Para Term Preterm AB TAB SAB Ectopic Multiple Living   2 1 1       1      Past Medical History  Diagnosis Date  . Headache(784.0)     not requiring meds  . Infection     UTI  . MRSA infection     reports hx- ? 4 yrs ago  . Gall stones    Past Surgical History  Procedure Laterality Date  . No past surgeries     Family History: family history includes Asthma in her brother; Heart disease in her father; Restless legs syndrome in her mother and sister; Stroke in her paternal grandmother. Social History:  reports that she has never smoked. She has never used smokeless tobacco. She reports that she does not drink alcohol or use illicit drugs.   Prenatal Transfer Tool  Maternal Diabetes: too early Genetic Screening: Declined Maternal Ultrasounds/Referrals: Normal, limited cardiac views Fetal Ultrasounds or other Referrals:  None Maternal Substance Abuse:  No Significant Maternal Medications:  None Significant Maternal Lab Results:  Lab values include: Other: Rubella non-immune Other Comments:    ROS    Blood pressure 132/80, pulse 133, temperature 100.8 F (38.2 C), temperature source Oral, resp. rate 18, last menstrual period 12/11/2014. Exam Physical Exam  Prenatal labs: ABO, Rh:  O positive Antibody:  Negative Rubella:  Non-immune RPR:   NR HBsAg:   Neg HIV:   NR GBS:     Assessment/Plan: 1) Admit 2) Rocephin 2gm IV q 24 3) Blood cx x 2, urine culture 4) Antiemetics and antipyretics  prn   Madeline [name] H. 05/01/2015, 7:32 PM

## 2015-05-01 NOTE — MAU Provider Note (Signed)
None     Chief Complaint:  Emesis and Fever   Madeline [name] is  24 y.o. G2P1001 at [redacted]w[redacted]d presents complaining of Emesis and Fever She started having nausea and vomiting yesterday am.This evening she took her temp and it was 100.8.  Has not taken any meds.  Having trouble keeping even liquids down.  Denies back pain, no UTI sx.   Sometimes feels like "baby kicks my bladder and I feel like I have to pee but only a little comes out."  Obstetrical/Gynecological History: OB History    Gravida Para Term Preterm AB TAB SAB Ectopic Multiple Living   Past Medical History: Past Medical History  Diagnosis Date  . Headache(784.0)     not requiring meds  . Infection     UTI  . MRSA infection     reports hx- ? 4 yrs ago  . Gall stones     Past Surgical History: Past Surgical History  Procedure Laterality Date  . No past surgeries      Family History: Family History  Problem Relation Age of Onset  . Asthma Brother   . Restless legs syndrome Mother   . Restless legs syndrome Sister   . Heart disease Father   . Stroke Paternal Grandmother     Social History: Social History  Substance Use Topics  . Smoking status: Never Smoker   . Smokeless tobacco: Never Used  . Alcohol Use: No    Allergies: No Known Allergies  Meds:  Prescriptions prior to admission  Medication Sig Dispense Refill Last Dose  . acetaminophen (TYLENOL) 500 MG tablet Take 1,000 mg by mouth every 6 (six) hours as needed for mild pain or fever.   05/01/2015 at Unknown time  . Prenatal Vit-Fe Fumarate-FA (PRENATAL MULTIVITAMIN) TABS tablet Take 1 tablet by mouth daily at 12 noon.   05/01/2015 at Unknown time  . ranitidine (ZANTAC) 150 MG tablet Take 150 mg by mouth 2 (two) times daily as needed for heartburn.   05/01/2015 at Unknown time  . terconazole (TERAZOL 7) 0.4 % vaginal cream Place 1 applicator vaginally at bedtime. (Patient not taking: Reported on 05/01/2015) 45 g 0 Completed  Course at Unknown time    Review of Systems   Constitutional: POSITIVE for fever and chills Eyes: Negative for visual disturbances Respiratory: Negative for shortness of breath, dyspnea Cardiovascular: Negative for chest pain or palpitations  Gastrointestinal: POSITIVE for vomiting, negative for diarrhea and constipation Genitourinary: Negative for dysuria  Musculoskeletal: Negative for back pain, joint pain, myalgias.  Normal ROM  Neurological: Negative for dizziness and headaches    Physical Exam  Blood pressure 132/80, pulse 133, temperature 100.8 F (38.2 C), temperature source Oral, resp. rate 18, last menstrual period 12/11/2014. GENERAL: Well-developed, well-nourished female in no acute distress.  LUNGS: Clear to auscultation bilaterally.  HEART: Regular rate and rhythm. ABDOMEN: Soft, nontender, nondistended, gravid.  EXTREMITIES: Nontender, no edema, 2+ distal pulses. DTR's 2+ FHT:  + doppler  Labs: Results for orders placed or performed during the hospital encounter of 05/01/15 (from the past 24 hour(s))  Urinalysis, Routine w reflex microscopic (not at [facility])   Collection Time: 05/01/15  6:15 PM  Result Value Ref Range   Color, Urine YELLOW YELLOW   APPearance CLOUDY (A) CLEAR   Specific Gravity, Urine 1.010 1.005 - 1.030   pH 7.0 5.0 - 8.0   Glucose, UA NEGATIVE NEGATIVE mg/dL  Hgb urine dipstick SMALL (A) NEGATIVE   Bilirubin Urine NEGATIVE NEGATIVE   Ketones, ur 15 (A) NEGATIVE mg/dL   Protein, ur NEGATIVE NEGATIVE mg/dL   Nitrite POSITIVE (A) NEGATIVE   Leukocytes, UA LARGE (A) NEGATIVE  Urine microscopic-add on   Collection Time: 05/01/15  6:15 PM  Result Value Ref Range   Squamous Epithelial / LPF 0-5 (A) NONE SEEN   WBC, UA TOO NUMEROUS TO COUNT 0 - 5 WBC/hpf   RBC / HPF 0-5 0 - 5 RBC/hpf   Bacteria, UA MANY (A) NONE SEEN  CBC with Differential/Platelet   Collection Time: 05/01/15  6:45 PM  Result Value Ref Range   WBC 11.0 (H) 4.0 - 10.5 K/uL    RBC 3.78 (L) 3.87 - 5.11 MIL/uL   Hemoglobin 11.0 (L) 12.0 - 15.0 g/dL   HCT 16.132.8 (L) 09.636.0 - 04.546.0 %   MCV 86.8 78.0 - 100.0 fL   MCH 29.1 26.0 - 34.0 pg   MCHC 33.5 30.0 - 36.0 g/dL   RDW 40.913.9 81.111.5 - 91.415.5 %   Platelets 259 150 - 400 K/uL   Neutrophils Relative % 82 %   Neutro Abs 9.0 (H) 1.7 - 7.7 K/uL   Lymphocytes Relative 10 %   Lymphs Abs 1.1 0.7 - 4.0 K/uL   Monocytes Relative 8 %   Monocytes Absolute 0.9 0.1 - 1.0 K/uL   Eosinophils Relative 0 %   Eosinophils Absolute 0.0 0.0 - 0.7 K/uL   Basophils Relative 0 %   Basophils Absolute 0.0 0.0 - 0.1 K/uL   Imaging Studies:  No results found.  Assessment: Madeline [name] is  24 y.o. G2P1001 at 846w1d presents with presumed pyelonephritis.  Plan: Discussed with Dr. Tenny Crawoss.  Will admit  CRESENZO-DISHMAN,Dontrez Pettis 12/24/20167:24 PM

## 2015-05-02 MED ORDER — DEXTROSE 5 % IV SOLN
2.0000 g | Freq: Once | INTRAVENOUS | Status: AC
  Administered 2015-05-02: 2 g via INTRAVENOUS
  Filled 2015-05-02: qty 2

## 2015-05-02 MED ORDER — ONDANSETRON HCL 4 MG PO TABS: 4.0000 mg | ORAL_TABLET | Freq: Three times a day (TID) | ORAL | Status: DC | PRN

## 2015-05-02 MED ORDER — CEPHALEXIN 500 MG PO CAPS: 500.0000 mg | ORAL_CAPSULE | Freq: Two times a day (BID) | ORAL | Status: AC

## 2015-05-02 NOTE — Progress Notes (Signed)
Chaplain visit the result of a staff referral.  Madeline [name] is in the hospital on Christmas with young children at home. This is a depressing situation. Chaplain and pt talked briefly to lift her spirits in anticipation of her children visiting later in the day, and opening a few presents. Pt appreciated the chaplain stopping by to talk and provide comfort.  Madeline [name], DMin Chaplain

## 2015-05-02 NOTE — Discharge Summary (Signed)
Obstetric Discharge Summary Reason for Admission: pyelonephritis Prenatal Procedures: ultrasound Intrapartum Procedures: IV antibiotics  HEMOGLOBIN  Date Value Ref Range Status  05/01/2015 11.0* 12.0 - 15.0 g/dL Final  95/62/130805/06/2012 65.712.7 g/dL Final   HCT  Date Value Ref Range Status  05/01/2015 32.8* 36.0 - 46.0 % Final  09/06/2012 38 % Final    Physical Exam:  General: alert, cooperative and appears stated age   Discharge Diagnoses: pyelonephritis in pregnancy  Discharge Information: Date: 05/02/2015 Activity: unrestricted Diet: routine Medications: Keflex and zofran Condition: improved Instructions: refer to practice specific booklet Discharge to: home Follow-up Information    Follow up with Philip AspenALLAHAN, SIDNEY, DO On 05/12/2015.   Specialty:  Obstetrics and Gynecology   Why:  Keep your previously scheduled prenatal appointment in the office on Jamuary 4th   Contact information:   [address] [place] [address] [phone]        Almon HerculesOSS,Rhiannan Kievit H. 05/02/2015, 1:16 PM

## 2015-05-02 NOTE — Plan of Care (Signed)
Problem: Safety: Goal: Ability to remain free from injury will improve Outcome: Completed/Met Date Met:  05/02/15 Safety plan discussed with patient.  Problem: Tissue Perfusion: Goal: Risk factors for ineffective tissue perfusion will decrease Outcome: Completed/Met Date Met:  05/02/15 VSS stable.  Problem: Fluid Volume: Goal: Ability to maintain a balanced intake and output will improve Outcome: Completed/Met Date Met:  05/02/15 Good urine output.  Problem: Nutrition: Goal: Adequate nutrition will be maintained Outcome: Completed/Met Date Met:  05/02/15 Tolerated a Regular diet.

## 2015-05-04 LAB — CULTURE, OB URINE: Culture: >=100000

## 2015-05-07 LAB — CULTURE, BLOOD (ROUTINE X 2)
Culture: NO GROWTH
Culture: NO GROWTH

## 2015-05-09 NOTE — L&D Delivery Note (Signed)
Patient was C/C/+2 and pushed for 1 minute with epidural.   NSVD  female infant, Apgars 9/9, weight pending.   The patient had a 2nd degree laceration repaired with 2-0 vicryl. Fundus was firm. EBL was expected amount. Placenta was delivered intact. Vagina was clear.  Baby was vigorous and doing skin to skin with mother.  Madeline [name], Madeline [name]

## 2015-07-06 ENCOUNTER — Inpatient Hospital Stay (HOSPITAL_COMMUNITY)
Admission: AD | Admit: 2015-07-06 | Payer: Medicaid Other | Source: Ambulatory Visit | Admitting: Obstetrics and Gynecology

## 2015-08-18 LAB — OB RESULTS CONSOLE GBS: GBS: NEGATIVE

## 2015-09-21 ENCOUNTER — Encounter (HOSPITAL_COMMUNITY): Payer: Self-pay | Admitting: *Deleted

## 2015-09-21 ENCOUNTER — Other Ambulatory Visit: Payer: Self-pay | Admitting: Obstetrics and Gynecology

## 2015-09-21 ENCOUNTER — Telehealth (HOSPITAL_COMMUNITY): Payer: Self-pay | Admitting: *Deleted

## 2015-09-21 NOTE — Telephone Encounter (Signed)
Preadmission screen  

## 2015-09-23 ENCOUNTER — Encounter (HOSPITAL_COMMUNITY): Payer: Self-pay

## 2015-09-23 ENCOUNTER — Inpatient Hospital Stay (HOSPITAL_COMMUNITY): Payer: Medicaid Other | Admitting: Anesthesiology

## 2015-09-23 ENCOUNTER — Inpatient Hospital Stay (HOSPITAL_COMMUNITY)
Admission: RE | Admit: 2015-09-23 | Discharge: 2015-09-24 | DRG: 775 | Disposition: A | Discharge status: Home / Self Care | Payer: Medicaid Other | Source: Ambulatory Visit | Attending: Obstetrics and Gynecology | Admitting: Obstetrics and Gynecology

## 2015-09-23 DIAGNOSIS — Z789 Other specified health status: Secondary | ICD-10-CM | POA: Diagnosis not present

## 2015-09-23 DIAGNOSIS — Z23 Encounter for immunization: Secondary | ICD-10-CM

## 2015-09-23 DIAGNOSIS — Z3A00 Weeks of gestation of pregnancy not specified: Secondary | ICD-10-CM | POA: Diagnosis not present

## 2015-09-23 DIAGNOSIS — Z3A40 40 weeks gestation of pregnancy: Secondary | ICD-10-CM | POA: Diagnosis not present

## 2015-09-23 DIAGNOSIS — O2301 Infections of kidney in pregnancy, first trimester: Secondary | ICD-10-CM | POA: Diagnosis present

## 2015-09-23 DIAGNOSIS — O480 Post-term pregnancy: Secondary | ICD-10-CM | POA: Diagnosis present

## 2015-09-23 DIAGNOSIS — Z3490 Encounter for supervision of normal pregnancy, unspecified, unspecified trimester: Secondary | ICD-10-CM

## 2015-09-23 LAB — CBC
HEMATOCRIT: 34 % — AB (ref 36.0–46.0)
Hemoglobin: 11.3 g/dL — ABNORMAL LOW (ref 12.0–15.0)
MCH: 28.1 pg (ref 26.0–34.0)
MCHC: 33.2 g/dL (ref 30.0–36.0)
MCV: 84.6 fL (ref 78.0–100.0)
PLATELETS: 295 10*3/uL (ref 150–400)
RBC: 4.02 MIL/uL (ref 3.87–5.11)
RDW: 14.7 % (ref 11.5–15.5)
WBC: 7.7 10*3/uL (ref 4.0–10.5)

## 2015-09-23 LAB — TYPE AND SCREEN
ABO/RH(D): O POS
ANTIBODY SCREEN: NEGATIVE

## 2015-09-23 MED ORDER — FENTANYL 2.5 MCG/ML BUPIVACAINE 1/10 % EPIDURAL INFUSION (WH - ANES)
14.0000 mL/h | INTRAMUSCULAR | Status: DC | PRN
Start: 2015-09-23 — End: 2015-09-23
  Administered 2015-09-23: 12 mL/h via EPIDURAL
  Administered 2015-09-23: 14 mL/h via EPIDURAL
  Filled 2015-09-23: qty 125

## 2015-09-23 MED ORDER — TERBUTALINE SULFATE 1 MG/ML IJ SOLN: 0.2500 mg | Freq: Once | INTRAMUSCULAR | Status: DC | PRN

## 2015-09-23 MED ORDER — IBUPROFEN 600 MG PO TABS
600.0000 mg | ORAL_TABLET | Freq: Four times a day (QID) | ORAL | Status: DC
  Administered 2015-09-23 – 2015-09-24 (×4): 600 mg via ORAL
  Filled 2015-09-23 (×6): qty 1

## 2015-09-23 MED ORDER — EPHEDRINE 5 MG/ML INJ: 10.0000 mg | INTRAVENOUS | Status: DC | PRN

## 2015-09-23 MED ORDER — LIDOCAINE HCL (PF) 1 % IJ SOLN
30.0000 mL | INTRAMUSCULAR | Status: DC | PRN
Start: 2015-09-23 — End: 2015-09-23
  Filled 2015-09-23: qty 30

## 2015-09-23 MED ORDER — OXYCODONE-ACETAMINOPHEN 5-325 MG PO TABS: 2.0000 | ORAL_TABLET | ORAL | Status: DC | PRN

## 2015-09-23 MED ORDER — ZOLPIDEM TARTRATE 5 MG PO TABS: 5.0000 mg | ORAL_TABLET | Freq: Every evening | ORAL | Status: DC | PRN

## 2015-09-23 MED ORDER — OXYTOCIN 40 UNITS IN LACTATED RINGERS INFUSION - SIMPLE MED
2.5000 [IU]/h | INTRAVENOUS | Status: DC
  Administered 2015-09-23: 2.5 [IU]/h via INTRAVENOUS

## 2015-09-23 MED ORDER — ACETAMINOPHEN 325 MG PO TABS: 650.0000 mg | ORAL_TABLET | ORAL | Status: DC | PRN

## 2015-09-23 MED ORDER — BENZOCAINE-MENTHOL 20-0.5 % EX AERO
1.0000 | INHALATION_SPRAY | CUTANEOUS | Status: DC | PRN
Start: 2015-09-23 — End: 2015-09-24
  Administered 2015-09-24: 1 via TOPICAL
  Filled 2015-09-23 (×2): qty 56

## 2015-09-23 MED ORDER — LACTATED RINGERS IV SOLN
INTRAVENOUS | Status: DC
  Administered 2015-09-23 (×2): via INTRAVENOUS

## 2015-09-23 MED ORDER — SENNOSIDES-DOCUSATE SODIUM 8.6-50 MG PO TABS
2.0000 | ORAL_TABLET | ORAL | Status: DC
  Filled 2015-09-23: qty 2

## 2015-09-23 MED ORDER — OXYTOCIN 40 UNITS IN LACTATED RINGERS INFUSION - SIMPLE MED: 1.0000 m[IU]/min | INTRAVENOUS | Status: DC

## 2015-09-23 MED ORDER — FLEET ENEMA 7-19 GM/118ML RE ENEM: 1.0000 | ENEMA | RECTAL | Status: DC | PRN

## 2015-09-23 MED ORDER — ONDANSETRON HCL 4 MG PO TABS: 4.0000 mg | ORAL_TABLET | ORAL | Status: DC | PRN

## 2015-09-23 MED ORDER — COCONUT OIL OIL
1.0000 "application " | TOPICAL_OIL | Status: DC | PRN
  Administered 2015-09-24: 1 via TOPICAL
  Filled 2015-09-23 (×2): qty 120

## 2015-09-23 MED ORDER — OXYTOCIN 40 UNITS IN LACTATED RINGERS INFUSION - SIMPLE MED
1.0000 m[IU]/min | INTRAVENOUS | Status: DC
  Administered 2015-09-23: 2 m[IU]/min via INTRAVENOUS
  Filled 2015-09-23: qty 1000

## 2015-09-23 MED ORDER — PRENATAL MULTIVITAMIN CH
1.0000 | ORAL_TABLET | Freq: Every day | ORAL | Status: DC
  Administered 2015-09-24: 1 via ORAL
  Filled 2015-09-23: qty 1

## 2015-09-23 MED ORDER — PHENYLEPHRINE 40 MCG/ML (10ML) SYRINGE FOR IV PUSH (FOR BLOOD PRESSURE SUPPORT)
80.0000 ug | PREFILLED_SYRINGE | INTRAVENOUS | Status: DC | PRN
  Filled 2015-09-23: qty 10

## 2015-09-23 MED ORDER — LIDOCAINE HCL (PF) 1 % IJ SOLN
INTRAMUSCULAR | Status: DC | PRN
  Administered 2015-09-23 (×2): 4 mL

## 2015-09-23 MED ORDER — OXYCODONE-ACETAMINOPHEN 5-325 MG PO TABS: 1.0000 | ORAL_TABLET | ORAL | Status: DC | PRN

## 2015-09-23 MED ORDER — LACTATED RINGERS IV SOLN
500.0000 mL | Freq: Once | INTRAVENOUS | Status: AC
  Administered 2015-09-23: 1000 mL via INTRAVENOUS

## 2015-09-23 MED ORDER — DIPHENHYDRAMINE HCL 50 MG/ML IJ SOLN: 12.5000 mg | INTRAMUSCULAR | Status: DC | PRN

## 2015-09-23 MED ORDER — WITCH HAZEL-GLYCERIN EX PADS
1.0000 | MEDICATED_PAD | CUTANEOUS | Status: DC | PRN
Start: 2015-09-23 — End: 2015-09-24

## 2015-09-23 MED ORDER — DIPHENHYDRAMINE HCL 25 MG PO CAPS: 25.0000 mg | ORAL_CAPSULE | Freq: Four times a day (QID) | ORAL | Status: DC | PRN

## 2015-09-23 MED ORDER — DIBUCAINE 1 % RE OINT: 1.0000 "application " | TOPICAL_OINTMENT | RECTAL | Status: DC | PRN

## 2015-09-23 MED ORDER — SIMETHICONE 80 MG PO CHEW: 80.0000 mg | CHEWABLE_TABLET | ORAL | Status: DC | PRN

## 2015-09-23 MED ORDER — CITRIC ACID-SODIUM CITRATE 334-500 MG/5ML PO SOLN: 30.0000 mL | ORAL | Status: DC | PRN

## 2015-09-23 MED ORDER — PHENYLEPHRINE 40 MCG/ML (10ML) SYRINGE FOR IV PUSH (FOR BLOOD PRESSURE SUPPORT): 80.0000 ug | PREFILLED_SYRINGE | INTRAVENOUS | Status: DC | PRN

## 2015-09-23 MED ORDER — ONDANSETRON HCL 4 MG/2ML IJ SOLN: 4.0000 mg | Freq: Four times a day (QID) | INTRAMUSCULAR | Status: DC | PRN

## 2015-09-23 MED ORDER — TETANUS-DIPHTH-ACELL PERTUSSIS 5-2.5-18.5 LF-MCG/0.5 IM SUSP
0.5000 mL | Freq: Once | INTRAMUSCULAR | Status: DC
  Filled 2015-09-23: qty 0.5

## 2015-09-23 MED ORDER — LACTATED RINGERS IV SOLN: 500.0000 mL | INTRAVENOUS | Status: DC | PRN

## 2015-09-23 MED ORDER — ONDANSETRON HCL 4 MG/2ML IJ SOLN: 4.0000 mg | INTRAMUSCULAR | Status: DC | PRN

## 2015-09-23 MED ORDER — OXYTOCIN BOLUS FROM INFUSION
500.0000 mL | INTRAVENOUS | Status: DC
  Administered 2015-09-23: 500 mL via INTRAVENOUS

## 2015-09-23 NOTE — Anesthesia Procedure Notes (Signed)
Epidural Patient location during procedure: OB  Staffing Anesthesiologist: Qualyn Oyervides Performed by: anesthesiologist   Preanesthetic Checklist Completed: patient identified, site marked, surgical consent, pre-op evaluation, timeout performed, IV checked, risks and benefits discussed and monitors and equipment checked  Epidural Patient position: sitting Prep: site prepped and draped and DuraPrep Patient monitoring: continuous pulse ox and blood pressure Approach: midline Location: L3-L4 Injection technique: LOR saline  Needle:  Needle type: Tuohy  Needle gauge: 17 G Needle length: 9 cm and 9 Needle insertion depth: 6 cm Catheter type: closed end flexible Catheter size: 19 Gauge Catheter at skin depth: 10 cm Test dose: negative  Assessment Events: blood not aspirated, injection not painful, no injection resistance, negative IV test and no paresthesia  Additional Notes Patient identified. Risks/Benefits/Options discussed with patient including but not limited to bleeding, infection, nerve damage, paralysis, failed block, incomplete pain control, headache, blood pressure changes, nausea, vomiting, reactions to medication both or allergic, itching and postpartum back pain. Confirmed with bedside nurse the patient's most recent platelet count. Confirmed with patient that they are not currently taking any anticoagulation, have any bleeding history or any family history of bleeding disorders. Patient expressed understanding and wished to proceed. All questions were answered. Sterile technique was used throughout the entire procedure. Please see nursing notes for vital signs. Test dose was given through epidural catheter and negative prior to continuing to dose epidural or start infusion. Warning signs of high block given to the patient including shortness of breath, tingling/numbness in hands, complete motor block, or any concerning symptoms with instructions to call for help. Patient was  given instructions on fall risk and not to get out of bed. All questions and concerns addressed with instructions to call with any issues or inadequate analgesia.    

## 2015-09-23 NOTE — Lactation Note (Signed)
This note was copied from a baby's chart. Lactation Consultation Note  Patient Name: Boy Teressa SenterJessica Drennen AOZHY'QToday's Date: 09/23/2015 Reason for consult: Initial assessment Baby at 6 hr of life and mom repots bf is going well. She bf for 2 wk then pumped for 2 months with her older child because he had problems latching. She would like to bf 12 m with this baby. She denies breast or nipple pain, voiced no concerns. Discussed baby behavior, feeding frequency, baby belly size, voids, wt loss, breast changes, and nipple care. Demonstrated manual expression, colostrum noted bilaterally, spoon in room. Given lactation handouts. Aware of OP services and support group.     Maternal Data    Feeding Feeding Type: Breast Fed Length of feed: 10 min  LATCH Score/Interventions Latch: Repeated attempts needed to sustain latch, nipple held in mouth throughout feeding, stimulation needed to elicit sucking reflex. Intervention(s): Adjust position;Assist with latch;Breast compression  Audible Swallowing: A few with stimulation Intervention(s): Skin to skin;Hand expression  Type of Nipple: Flat Intervention(s): Reverse pressure  Comfort (Breast/Nipple): Soft / non-tender     Hold (Positioning): Assistance needed to correctly position infant at breast and maintain latch. Intervention(s): Breastfeeding basics reviewed;Position options;Support Pillows;Skin to skin  LATCH Score: 6  Lactation Tools Discussed/Used WIC Program: Yes   Consult Status Consult Status: Follow-up Date: 09/24/15 Follow-up type: In-patient    Rulon Eisenmengerlizabeth E Melvyn Hommes 09/23/2015, 10:51 PM

## 2015-09-23 NOTE — Anesthesia Preprocedure Evaluation (Signed)
Anesthesia Evaluation  Patient identified by MRN, date of birth, ID band Patient awake    Reviewed: Allergy & Precautions, NPO status , Patient's Chart, lab work & pertinent test results  History of Anesthesia Complications Negative for: history of anesthetic complications  Airway Mallampati: II  TM Distance: >3 FB Neck ROM: Full    Dental no notable dental hx. (+) Dental Advisory Given   Pulmonary neg pulmonary ROS,    Pulmonary exam normal breath sounds clear to auscultation       Cardiovascular negative cardio ROS Normal cardiovascular exam Rhythm:Regular Rate:Normal     Neuro/Psych  Headaches, negative psych ROS   GI/Hepatic negative GI ROS, Neg liver ROS,   Endo/Other  Morbid obesity  Renal/GU negative Renal ROS  negative genitourinary   Musculoskeletal negative musculoskeletal ROS (+)   Abdominal   Peds negative pediatric ROS (+)  Hematology negative hematology ROS (+)   Anesthesia Other Findings   Reproductive/Obstetrics (+) Pregnancy                             Anesthesia Physical Anesthesia Plan  ASA: III  Anesthesia Plan: Epidural   Post-op Pain Management:    Induction:   Airway Management Planned:   Additional Equipment:   Intra-op Plan:   Post-operative Plan:   Informed Consent: I have reviewed the patients History and Physical, chart, labs and discussed the procedure including the risks, benefits and alternatives for the proposed anesthesia with the patient or authorized representative who has indicated his/her understanding and acceptance.   Dental advisory given  Plan Discussed with: CRNA  Anesthesia Plan Comments:         Anesthesia Quick Evaluation

## 2015-09-23 NOTE — Anesthesia Pain Management Evaluation Note (Signed)
  CRNA Pain Management Visit Note  Patient: Madeline [name], 25 y.o., female  "Hello I am a member of the anesthesia team at [facility]. We have an anesthesia team available at all times to provide care throughout the hospital, including epidural management and anesthesia for C-section. I don't know your plan for the delivery whether it a natural birth, water birth, IV sedation, nitrous supplementation, doula or epidural, but we want to meet your pain goals."   1.Was your pain managed to your expectations on prior hospitalizations?   Yes   2.What is your expectation for pain management during this hospitalization?     Epidural  3.How can we help you reach that goal? *epidural pending**  Record the patient's initial score and the patient's pain goal.   Pain: 0  Pain Goal: 7 [facility] wants you to be able to say your pain was always managed very well.  Edison PaceWILKERSON,Madeline [name] 09/23/2015

## 2015-09-23 NOTE — H&P (Signed)
25 y.o. 3267w6d  G2P1001 comes for scheduled IOL for ppd.  Otherwise has good fetal movement and no bleeding.  Past Medical History  Diagnosis Date  . Headache(784.0)     not requiring meds  . Infection     UTI  . MRSA infection     reports hx- ? 4 yrs ago  . Gall stones   . Pyelonephritis complicating pregnancy in first trimester, antepartum     Past Surgical History  Procedure Laterality Date  . No past surgeries      OB History  Gravida Para Term Preterm AB SAB TAB Ectopic Multiple Living  2 1 1       1     # Outcome Date GA Lbr Len/2nd Weight Sex Delivery Anes PTL Lv  2 Current           1 Term 03/14/13 6447w3d 11:44 / 00:29 3.524 kg (7 lb 12.3 oz) M Vag-Spont EPI  Y      Social History   Social History  . Marital Status: Married    Spouse Name: N/A  . Number of Children: N/A  . Years of Education: N/A   Occupational History  . Not on file.   Social History Main Topics  . Smoking status: Never Smoker   . Smokeless tobacco: Never Used  . Alcohol Use: No  . Drug Use: No  . Sexual Activity: Yes    Birth Control/ Protection: None     Comment: last sex Mar 14 2015   Other Topics Concern  . Not on file   Social History Narrative   Review of patient's allergies indicates no known allergies.    Prenatal Transfer Tool  Maternal Diabetes: No Genetic Screening: Declined Maternal Ultrasounds/Referrals: Normal Fetal Ultrasounds or other Referrals:  None Maternal Substance Abuse:  No Significant Maternal Medications:  Meds include: Other: daily macrobid  Significant Maternal Lab Results: Lab values include: Group B Strep negative  Other PNC: h/o pyelonephritis 04/2015 on daily suppressive abx, obesity, Rubella Non-immune    There were no vitals filed for this visit.   Lungs/Cor:  NAD Abdomen:  soft, gravid Ex:  no cords, erythema SVE:  2/60/-2 in office FHTs: pending Toco:  Pending   A/P   Admit for IOL for ppd  GBS neg  Pitocin augmentation 2x2  Other  routine care  PronghornALLAHAN, Luther ParodySIDNEY

## 2015-09-24 ENCOUNTER — Ambulatory Visit: Payer: Self-pay

## 2015-09-24 LAB — RPR: RPR: NONREACTIVE

## 2015-09-24 LAB — CBC
HEMATOCRIT: 30.1 % — AB (ref 36.0–46.0)
HEMOGLOBIN: 9.9 g/dL — AB (ref 12.0–15.0)
MCH: 28 pg (ref 26.0–34.0)
MCHC: 32.9 g/dL (ref 30.0–36.0)
MCV: 85 fL (ref 78.0–100.0)
Platelets: 252 10*3/uL (ref 150–400)
RBC: 3.54 MIL/uL — AB (ref 3.87–5.11)
RDW: 14.9 % (ref 11.5–15.5)
WBC: 9.4 10*3/uL (ref 4.0–10.5)

## 2015-09-24 LAB — CCBB MATERNAL DONOR DRAW

## 2015-09-24 MED ORDER — IBUPROFEN 600 MG PO TABS: 600.0000 mg | ORAL_TABLET | Freq: Four times a day (QID) | ORAL | Status: DC | PRN

## 2015-09-24 MED ORDER — MEASLES, MUMPS & RUBELLA VAC ~~LOC~~ INJ
0.5000 mL | INJECTION | Freq: Once | SUBCUTANEOUS | Status: AC
  Administered 2015-09-24: 0.5 mL via SUBCUTANEOUS
  Filled 2015-09-24 (×2): qty 0.5

## 2015-09-24 MED ORDER — LANOLIN HYDROUS EX OINT: 1.0000 "application " | TOPICAL_OINTMENT | Freq: Two times a day (BID) | CUTANEOUS | Status: DC

## 2015-09-24 NOTE — Progress Notes (Signed)
Post Partum Day 1 Subjective: no complaints, up ad lib, voiding, tolerating PO, + flatus and Patient is breast feeding. Mother and baby bonding well  Objective: Blood pressure 125/68, pulse 74, temperature 98 F (36.7 C), temperature source Axillary, resp. rate 18, height 5\' 2"  (1.575 m), weight 101.152 kg (223 lb), last menstrual period 12/11/2014, SpO2 100 %, unknown if currently breastfeeding.  Physical Exam:  General: alert, cooperative and no distress Lochia: appropriate Uterine Fundus: firm U-1 perineum: healing well, no significant drainage, no dehiscence, no significant erythema DVT Evaluation: No evidence of DVT seen on physical exam. Negative Homan's sign. No cords or calf tenderness. No significant calf/ankle edema.   Recent Labs  09/23/15 0800 09/24/15 0551  HGB 11.3* 9.9*  HCT 34.0* 30.1*    Assessment/Plan: Discharge home, Breastfeeding, Does not desire circumcision  and Contraception will discuss at post partum visit   LOS: 1 day   Madeline [name] STACIA 09/24/2015, 8:41 AM

## 2015-09-24 NOTE — Discharge Summary (Signed)
Obstetric Discharge Summary Reason for Admission: induction of labor Prenatal Procedures: NST Intrapartum Procedures: spontaneous vaginal delivery Postpartum Procedures: none Complications-Operative and Postpartum: 2nd degree perineal laceration HEMOGLOBIN  Date Value Ref Range Status  09/24/2015 9.9* 12.0 - 15.0 g/dL Final  16/10/960405/06/2012 54.012.7 g/dL Final   HCT  Date Value Ref Range Status  09/24/2015 30.1* 36.0 - 46.0 % Final  09/06/2012 38 % Final    Physical Exam:  General: alert, cooperative and no distress Lochia: appropriate Uterine Fundus: firm U-1 perineum: healing well, no significant drainage, no dehiscence, no significant erythema DVT Evaluation: No evidence of DVT seen on physical exam. Negative Homan's sign. No cords or calf tenderness. No significant calf/ankle edema.  Discharge Diagnoses: Term Pregnancy-delivered  Discharge Information: Date: 09/24/2015 Activity: pelvic rest Diet: routine Medications: PNV and Ibuprofen Condition: stable Instructions: refer to practice specific booklet Discharge to: home   Newborn Data: Live born female  Birth Weight: 7 lb 15.7 oz (3620 g) APGAR: 9, 9  Home with mother.  Essie HartINN, Modestine Scherzinger STACIA 09/24/2015, 8:52 AM

## 2015-09-24 NOTE — Lactation Note (Signed)
This note was copied from a baby's chart. Lactation Consultation Note  Patient Name: Boy Teressa SenterJessica Swiss ZOXWR'UToday's Date: 09/24/2015  RN called for LC to give Mom larger flange for hand pump. Mom given 27 flange and appeared to fit well. Mom denied discomfort.    Maternal Data    Feeding Feeding Type: Breast Fed Length of feed: 15 min  LATCH Score/Interventions Latch: Grasps breast easily, tongue down, lips flanged, rhythmical sucking.  Audible Swallowing: A few with stimulation  Type of Nipple: Everted at rest and after stimulation  Comfort (Breast/Nipple): Soft / non-tender     Hold (Positioning): No assistance needed to correctly position infant at breast. Intervention(s): Skin to skin  LATCH Score: 9  Lactation Tools Discussed/Used     Consult Status      Alfred LevinsGranger, Revan Gendron Ann 09/24/2015, 7:28 PM

## 2015-09-24 NOTE — Progress Notes (Signed)
UR chart review completed.  

## 2015-09-24 NOTE — Anesthesia Postprocedure Evaluation (Signed)
Anesthesia Post Note  Patient: Madeline [name]  Procedure(s) Performed: * No procedures listed *  Patient location during evaluation: Mother Baby Anesthesia Type: Epidural Level of consciousness: oriented and awake and alert Pain management: pain level controlled Vital Signs Assessment: post-procedure vital signs reviewed and stable Respiratory status: spontaneous breathing and nonlabored ventilation Cardiovascular status: stable Postop Assessment: epidural receding, patient able to bend at knees, no signs of nausea or vomiting and adequate PO intake Anesthetic complications: no     Last Vitals:  Filed Vitals:   09/23/15 1950 09/24/15 0020  BP: 136/83 125/68  Pulse: 91 74  Temp: 36.7 C 36.7 C  Resp: 18 18    Last Pain:  Filed Vitals:   09/24/15 0034  PainSc: 0-No pain   Pain Goal: Patients Stated Pain Goal: 7 (09/23/15 0744)               Laban EmperorMalinova,Daemien Fronczak Hristova

## 2015-09-25 ENCOUNTER — Ambulatory Visit: Payer: Self-pay

## 2015-09-25 NOTE — Lactation Note (Signed)
This note was copied from a baby's chart. Lactation Consultation Note  Patient Name: Madeline [name] ZOXWR'UToday's Date: 09/25/2015 Reason for consult: Follow-up assessment  Baby 40 hours old and has been exclusively breast fed and has been consistent. LC reviewed doc flow sheets and WNL for D/C  [facility] , multiply swallows noted with increase with breast compressions.  LC praised mom for exclusively breast  Feeding.  Per mom nipples sensitive - [facility] RN had given mom coconut oil. Also breast are fuller, warmer , sign milk coming in .  Sore nipple and engorgement prevention and tx reviewed.  Mom already has a hand pump , and per mom  has a DEBP at home  [facility] highly encouraged mom to do skin to skin feedings until the baby can stay awake for a feeding.  Per m om active with WIC .  Mother informed of post-discharge support and given phone number to the lactation department, including services for phone call assistance; out-patient appointments; and breastfeeding support group. List of other breastfeeding resources in the community given in the handout. Encouraged mother to call for problems or concerns related to breastfeeding.   Maternal Data    Feeding Feeding Type:  (baby latched . multiply swallows ) Length of feed:  (multiply swallows, increased with compressions )  LATCH Score/Interventions Latch:  (latched with depth )  Audible Swallowing:  (multiply swallows )           Intervention(s): Breastfeeding basics reviewed     Lactation Tools Discussed/Used Tools: Pump Flange Size: 27 Breast pump type: Manual WIC Program: Yes (per mom [place] / WIC ) Pump Review: Setup, frequency, and cleaning;Milk Storage Initiated by:: reviewed / MAI  Date initiated:: 09/25/15   Consult Status Consult Status: Complete Date: 09/25/15    Kathrin Greathouseorio, Dominigue Gellner Ann 09/25/2015, 8:56 AM

## 2018-06-26 ENCOUNTER — Emergency Department (HOSPITAL_COMMUNITY)
Admission: EM | Admit: 2018-06-26 | Discharge: 2018-06-27 | Disposition: A | Discharge status: Home / Self Care | Payer: Self-pay | Attending: Emergency Medicine | Admitting: Emergency Medicine

## 2018-06-26 ENCOUNTER — Other Ambulatory Visit: Payer: Self-pay

## 2018-06-26 DIAGNOSIS — R8281 Pyuria: Secondary | ICD-10-CM | POA: Insufficient documentation

## 2018-06-26 DIAGNOSIS — Z79899 Other long term (current) drug therapy: Secondary | ICD-10-CM | POA: Insufficient documentation

## 2018-06-26 LAB — URINALYSIS, ROUTINE W REFLEX MICROSCOPIC
BILIRUBIN URINE: NEGATIVE
GLUCOSE, UA: NEGATIVE mg/dL
HGB URINE DIPSTICK: NEGATIVE
KETONES UR: 20 mg/dL — AB
Nitrite: NEGATIVE
PROTEIN: NEGATIVE mg/dL
Specific Gravity, Urine: 1.031 — ABNORMAL HIGH (ref 1.005–1.030)
pH: 5 (ref 5.0–8.0)

## 2018-06-26 LAB — PREGNANCY, URINE: Preg Test, Ur: NEGATIVE

## 2018-06-26 NOTE — ED Triage Notes (Signed)
Patient c/o UTI symptoms x1 week. States that urine is malodorous and cloudy. Now she is having body aches and chills.

## 2018-06-27 MED ORDER — ONDANSETRON 4 MG PO TBDP
8.0000 mg | ORAL_TABLET | Freq: Once | ORAL | Status: AC
  Administered 2018-06-27: 8 mg via ORAL
  Filled 2018-06-27: qty 2

## 2018-06-27 MED ORDER — CEPHALEXIN 500 MG PO CAPS: 500.0000 mg | ORAL_CAPSULE | Freq: Two times a day (BID) | ORAL | 0 refills | Status: AC

## 2018-06-27 MED ORDER — ONDANSETRON 4 MG PO TBDP: 4.0000 mg | ORAL_TABLET | Freq: Three times a day (TID) | ORAL | 0 refills | Status: DC | PRN

## 2018-06-27 MED ORDER — CEPHALEXIN 250 MG PO CAPS
500.0000 mg | ORAL_CAPSULE | Freq: Once | ORAL | Status: AC
  Administered 2018-06-27: 500 mg via ORAL
  Filled 2018-06-27: qty 2

## 2018-06-27 MED ORDER — IBUPROFEN 800 MG PO TABS
800.0000 mg | ORAL_TABLET | Freq: Once | ORAL | Status: DC
  Filled 2018-06-27: qty 1

## 2018-06-27 NOTE — ED Provider Notes (Signed)
[facility] Provider Note   CSN: [id number] Arrival date & time: 06/26/18  2133    History   Chief Complaint Chief Complaint  Patient presents with  . Dysuria    HPI Madeline [name] is a 28 y.o. female.     28 year old female presents to the emergency department for evaluation of stronger smelling urine.  She began noticing malodorous urine approximately 1 week ago.  No significant urgency or frequency.  Denies any noticeable dysuria.  She has progressively developed generalized fatigue and malaise with ongoing nausea.  Also reporting some discomfort in her right low back which is aggravated with inspiration.  No associated shortness of breath, cough, nasal congestion, vomiting, fevers, vaginal bleeding or discharge, abdominal pain.  States that her symptoms feel similar to prior urinary tract infections.  She was last treated for a UTI in August 2019.  Patient is sexually active with one female partner her spouse.  Denies concern for STDs.  The history is provided by the patient. No language interpreter was used.  Dysuria    Past Medical History:  Diagnosis Date  . Gall stones   . Headache(784.0)    not requiring meds  . Infection    UTI  . MRSA infection    reports hx- ? 4 yrs ago  . Pyelonephritis complicating pregnancy in first trimester, antepartum     Patient Active Problem List   Diagnosis Date Noted  . Pregnancy 09/23/2015  . Pyelonephritis affecting pregnancy in second trimester, antepartum 05/01/2015  . NSVD (normal spontaneous vaginal delivery) 03/16/2013    Past Surgical History:  Procedure Laterality Date  . NO PAST SURGERIES       OB History    Gravida  2   Para  2   Term  2   Preterm  0   AB  0   Living  2     SAB  0   TAB  0   Ectopic  0   Multiple  0   Live Births  2            Home Medications    Prior to Admission medications   Medication Sig Start Date End Date Taking?  Authorizing Provider  acetaminophen (TYLENOL) 500 MG tablet Take 1,000 mg by mouth every 6 (six) hours as needed for mild pain or fever.    [provider]  cephALEXin (KEFLEX) 500 MG capsule Take 1 capsule (500 mg total) by mouth 2 (two) times daily for 5 days. 06/27/18 07/02/18  Antony MaduraHumes, Kayshaun Polanco, PA-C  ibuprofen (ADVIL,MOTRIN) 600 MG tablet Take 1 tablet (600 mg total) by mouth every 6 (six) hours as needed for cramping. 09/24/15   Essie HartPinn, Walda, MD  lanolin OINT Apply 1 application topically 2 (two) times daily. 09/24/15   Essie HartPinn, Walda, MD  ondansetron (ZOFRAN ODT) 4 MG disintegrating tablet Take 1 tablet (4 mg total) by mouth every 8 (eight) hours as needed for nausea or vomiting. 06/27/18   Antony MaduraHumes, Alysse Rathe, PA-C  Prenatal Vit-Fe Fumarate-FA (PRENATAL MULTIVITAMIN) TABS tablet Take 1 tablet by mouth daily at 12 noon.    [provider]  ranitidine (ZANTAC) 150 MG tablet Take 150 mg by mouth 2 (two) times daily as needed for heartburn.    [provider]    Family History Family History  Problem Relation Age of Onset  . Asthma Brother   . Restless legs syndrome Mother   . Restless legs syndrome Sister   . Heart disease  Father   . Stroke Paternal Grandmother     Social History Social History   Tobacco Use  . Smoking status: Never Smoker  . Smokeless tobacco: Never Used  Substance Use Topics  . Alcohol use: No  . Drug use: No     Allergies   Patient has no known allergies.   Review of Systems Review of Systems  Genitourinary: Positive for dysuria.  Ten systems reviewed and are negative for acute change, except as noted in the HPI.    Physical Exam Updated Vital Signs BP 111/63 (BP Location: Right Arm)   Pulse 81   Temp 98.6 F (37 C) (Oral)   Resp 17   Ht 5\' 2"  (1.575 m)   Wt 76.2 kg   SpO2 98%   BMI 30.73 kg/m   Physical Exam Vitals signs and nursing note reviewed.  Constitutional:      General: She is not in acute distress.    Appearance: She  is well-developed. She is not diaphoretic.     Comments: Nontoxic appearing and in NAD  HENT:     Head: Normocephalic and atraumatic.  Eyes:     General: No scleral icterus.    Conjunctiva/sclera: Conjunctivae normal.  Neck:     Musculoskeletal: Normal range of motion.  Cardiovascular:     Rate and Rhythm: Normal rate and regular rhythm.     Pulses: Normal pulses.  Pulmonary:     Effort: Pulmonary effort is normal. No respiratory distress.     Breath sounds: No stridor. No wheezing or rales.     Comments: Lungs CTAB. Respirations even and unlabored. Abdominal:     Palpations: Abdomen is soft. There is no mass.     Tenderness: There is no abdominal tenderness. There is no guarding.     Comments: Abdomen soft, nondistended, nontender. No CVA TTP.  Musculoskeletal: Normal range of motion.  Skin:    General: Skin is warm and dry.     Coloration: Skin is not pale.     Findings: No erythema or rash.  Neurological:     Mental Status: She is alert and oriented to person, place, and time.     Coordination: Coordination normal.  Psychiatric:        Behavior: Behavior normal.      ED Treatments / Results  Labs (all labs ordered are listed, but only abnormal results are displayed) Labs Reviewed  URINALYSIS, ROUTINE W REFLEX MICROSCOPIC - Abnormal; Notable for the following components:      Result Value   APPearance HAZY (*)    Specific Gravity, Urine 1.031 (*)    Ketones, ur 20 (*)    Leukocytes,Ua TRACE (*)    Bacteria, UA RARE (*)    All other components within normal limits  URINE CULTURE  PREGNANCY, URINE    EKG None  Radiology No results found.  Procedures Procedures (including critical care time)  Medications Ordered in ED Medications  cephALEXin (KEFLEX) capsule 500 mg (has no administration in time range)  ibuprofen (ADVIL,MOTRIN) tablet 800 mg (has no administration in time range)  ondansetron (ZOFRAN-ODT) disintegrating tablet 8 mg (has no administration  in time range)     Initial Impression / Assessment and Plan / ED Course  I have reviewed the triage vital signs and the nursing notes.  Pertinent labs & imaging results that were available during my care of the patient were reviewed by me and considered in my medical decision making (see chart for details).  Patient has been diagnosed with a UTI. Patient is afebrile, no CVA tenderness, normotensive, and denies vomiting. Patient to be discharged home with antibiotics and instructions to follow up with PCP if symptoms persist. Return precautions discussed and provided. Patient discharged in stable condition with no unaddressed concerns.   Final Clinical Impressions(s) / ED Diagnoses   Final diagnoses:  Pyuria    ED Discharge Orders         Ordered    cephALEXin (KEFLEX) 500 MG capsule  2 times daily     06/27/18 0046    ondansetron (ZOFRAN ODT) 4 MG disintegrating tablet  Every 8 hours PRN     06/27/18 0046           Antony Madura, PA-C 06/27/18 0055    Dione Booze, MD 06/27/18 [phone]

## 2018-06-27 NOTE — Discharge Instructions (Signed)
Take Keflex as prescribed until finished for treatment of a UTI.  You may use Zofran as prescribed for nausea management.  Continue to remain well-hydrated by drinking plenty of fluids.  Follow-up with a primary care doctor to ensure resolution of symptoms.

## 2018-06-29 LAB — URINE CULTURE: Culture: 50000 — AB

## 2018-06-30 ENCOUNTER — Telehealth: Payer: Self-pay | Admitting: Emergency Medicine

## 2018-06-30 NOTE — Telephone Encounter (Signed)
Post ED Visit - Positive Culture Follow-up  Culture report reviewed by antimicrobial stewardship pharmacist: [facility] []  Enzo Bi, Pharm.D. []  Celedonio Miyamoto, Pharm.D., BCPS AQ-ID []  Garvin Fila, Pharm.D., BCPS []  Georgina Pillion, Pharm.D., BCPS []  [place], [place].D., BCPS, AAHIVP []  Estella Husk, Pharm.D., BCPS, AAHIVP []  Lysle Pearl, PharmD, BCPS []  Phillips Climes, PharmD, BCPS []  Agapito Games, PharmD, BCPS [x]  Verlan Friends, PharmD []  Mervyn Gay, PharmD, BCPS []  Vinnie Level, PharmD  [facility] []  Len Childs, PharmD []  Greer Pickerel, PharmD []  Adalberto Cole, PharmD []  Perlie Gold, Rph []  Lonell Face) Jean Rosenthal, PharmD []  Earl Many, PharmD []  Junita Push, PharmD []  Dorna Leitz, PharmD []  Terrilee Files, PharmD []  Lynann Beaver, PharmD []  Keturah Barre, PharmD []  Loralee Pacas, PharmD []  Bernadene Person, PharmD   Positive urine culture Treated with cephalexin, organism sensitive to the same and no further patient follow-up is required at this time.  Carollee Herter Emmaline Wahba 06/30/2018, 1:34 PM

## 2020-01-07 DIAGNOSIS — Z419 Encounter for procedure for purposes other than remedying health state, unspecified: Secondary | ICD-10-CM | POA: Diagnosis not present

## 2020-02-06 DIAGNOSIS — Z3483 Encounter for supervision of other normal pregnancy, third trimester: Secondary | ICD-10-CM | POA: Diagnosis not present

## 2020-02-06 DIAGNOSIS — Z124 Encounter for screening for malignant neoplasm of cervix: Secondary | ICD-10-CM | POA: Diagnosis not present

## 2020-02-06 DIAGNOSIS — R350 Frequency of micturition: Secondary | ICD-10-CM | POA: Diagnosis not present

## 2020-02-06 DIAGNOSIS — Z419 Encounter for procedure for purposes other than remedying health state, unspecified: Secondary | ICD-10-CM | POA: Diagnosis not present

## 2020-02-06 DIAGNOSIS — O26841 Uterine size-date discrepancy, first trimester: Secondary | ICD-10-CM | POA: Diagnosis not present

## 2020-02-06 DIAGNOSIS — Z3480 Encounter for supervision of other normal pregnancy, unspecified trimester: Secondary | ICD-10-CM | POA: Diagnosis not present

## 2020-02-06 LAB — OB RESULTS CONSOLE GC/CHLAMYDIA
Chlamydia: NEGATIVE
Gonorrhea: NEGATIVE

## 2020-03-05 DIAGNOSIS — Z3480 Encounter for supervision of other normal pregnancy, unspecified trimester: Secondary | ICD-10-CM | POA: Diagnosis not present

## 2020-03-05 LAB — OB RESULTS CONSOLE RUBELLA ANTIBODY, IGM: Rubella: NON-IMMUNE/NOT IMMUNE

## 2020-03-05 LAB — OB RESULTS CONSOLE HIV ANTIBODY (ROUTINE TESTING): HIV: NONREACTIVE

## 2020-03-05 LAB — OB RESULTS CONSOLE HEPATITIS B SURFACE ANTIGEN: Hepatitis B Surface Ag: NEGATIVE

## 2020-03-08 DIAGNOSIS — Z419 Encounter for procedure for purposes other than remedying health state, unspecified: Secondary | ICD-10-CM | POA: Diagnosis not present

## 2020-04-07 DIAGNOSIS — Z419 Encounter for procedure for purposes other than remedying health state, unspecified: Secondary | ICD-10-CM | POA: Diagnosis not present

## 2020-04-14 DIAGNOSIS — O99212 Obesity complicating pregnancy, second trimester: Secondary | ICD-10-CM | POA: Diagnosis not present

## 2020-04-14 DIAGNOSIS — Z3689 Encounter for other specified antenatal screening: Secondary | ICD-10-CM | POA: Diagnosis not present

## 2020-04-14 DIAGNOSIS — Z3A18 18 weeks gestation of pregnancy: Secondary | ICD-10-CM | POA: Diagnosis not present

## 2020-04-14 DIAGNOSIS — E669 Obesity, unspecified: Secondary | ICD-10-CM | POA: Diagnosis not present

## 2020-05-08 DIAGNOSIS — Z419 Encounter for procedure for purposes other than remedying health state, unspecified: Secondary | ICD-10-CM | POA: Diagnosis not present

## 2020-05-08 NOTE — L&D Delivery Note (Signed)
Delivery Note Patient pushed well with two contractions without epidural.  At 8:25 PM a viable female was delivered via Vaginal, Spontaneous (Presentation: Middle Occiput Posterior).  APGAR: 9, 9; weight 4lb 11.3 oz (2135 gm)  .   Placenta status: Spontaneous;Pathology, Intact.  Cord: 3 vessels with the following complications: Short cord and small placenta--sent to pathology.  Cord pH: n/a  Anesthesia: Local Episiotomy: None Lacerations: 1st degree Suture Repair: 3.0 vicryl rapide Est. Blood Loss (mL): 25  Mom to postpartum.  Baby to Couplet care / Skin to Skin.  [facility] GEFFEL Teylor Wolven 08/17/2020, 8:48 PM

## 2020-06-08 DIAGNOSIS — Z23 Encounter for immunization: Secondary | ICD-10-CM | POA: Diagnosis not present

## 2020-06-08 DIAGNOSIS — Z419 Encounter for procedure for purposes other than remedying health state, unspecified: Secondary | ICD-10-CM | POA: Diagnosis not present

## 2020-06-08 DIAGNOSIS — Z3480 Encounter for supervision of other normal pregnancy, unspecified trimester: Secondary | ICD-10-CM | POA: Diagnosis not present

## 2020-06-29 LAB — OB RESULTS CONSOLE RPR: RPR: NONREACTIVE

## 2020-07-02 DIAGNOSIS — Z3A30 30 weeks gestation of pregnancy: Secondary | ICD-10-CM | POA: Diagnosis not present

## 2020-07-02 DIAGNOSIS — O99213 Obesity complicating pregnancy, third trimester: Secondary | ICD-10-CM | POA: Diagnosis not present

## 2020-07-06 DIAGNOSIS — Z419 Encounter for procedure for purposes other than remedying health state, unspecified: Secondary | ICD-10-CM | POA: Diagnosis not present

## 2020-08-06 DIAGNOSIS — Z419 Encounter for procedure for purposes other than remedying health state, unspecified: Secondary | ICD-10-CM | POA: Diagnosis not present

## 2020-08-10 DIAGNOSIS — O99213 Obesity complicating pregnancy, third trimester: Secondary | ICD-10-CM | POA: Diagnosis not present

## 2020-08-10 DIAGNOSIS — Z3A35 35 weeks gestation of pregnancy: Secondary | ICD-10-CM | POA: Diagnosis not present

## 2020-08-17 ENCOUNTER — Encounter (HOSPITAL_COMMUNITY): Payer: Self-pay | Admitting: Obstetrics and Gynecology

## 2020-08-17 ENCOUNTER — Inpatient Hospital Stay (HOSPITAL_COMMUNITY): Payer: Medicaid Other

## 2020-08-17 ENCOUNTER — Other Ambulatory Visit: Payer: Self-pay

## 2020-08-17 ENCOUNTER — Inpatient Hospital Stay (HOSPITAL_COMMUNITY)
Admission: AD | Admit: 2020-08-17 | Discharge: 2020-08-20 | DRG: 807 | Disposition: A | Discharge status: Home / Self Care | Payer: Medicaid Other | Attending: Obstetrics | Admitting: Obstetrics

## 2020-08-17 DIAGNOSIS — O1413 Severe pre-eclampsia, third trimester: Secondary | ICD-10-CM | POA: Diagnosis present

## 2020-08-17 DIAGNOSIS — K8020 Calculus of gallbladder without cholecystitis without obstruction: Secondary | ICD-10-CM | POA: Diagnosis not present

## 2020-08-17 DIAGNOSIS — Z23 Encounter for immunization: Secondary | ICD-10-CM | POA: Diagnosis not present

## 2020-08-17 DIAGNOSIS — Z8614 Personal history of Methicillin resistant Staphylococcus aureus infection: Secondary | ICD-10-CM | POA: Diagnosis not present

## 2020-08-17 DIAGNOSIS — R109 Unspecified abdominal pain: Secondary | ICD-10-CM | POA: Diagnosis not present

## 2020-08-17 DIAGNOSIS — O1414 Severe pre-eclampsia complicating childbirth: Principal | ICD-10-CM | POA: Diagnosis present

## 2020-08-17 DIAGNOSIS — Z20822 Contact with and (suspected) exposure to covid-19: Secondary | ICD-10-CM | POA: Diagnosis present

## 2020-08-17 DIAGNOSIS — O43813 Placental infarction, third trimester: Secondary | ICD-10-CM | POA: Diagnosis not present

## 2020-08-17 DIAGNOSIS — O2662 Liver and biliary tract disorders in childbirth: Secondary | ICD-10-CM | POA: Diagnosis present

## 2020-08-17 DIAGNOSIS — Z3A36 36 weeks gestation of pregnancy: Secondary | ICD-10-CM

## 2020-08-17 LAB — COMPREHENSIVE METABOLIC PANEL
ALT: 19 U/L (ref 0–44)
AST: 24 U/L (ref 15–41)
Albumin: 2.7 g/dL — ABNORMAL LOW (ref 3.5–5.0)
Alkaline Phosphatase: 114 U/L (ref 38–126)
Anion gap: 9 (ref 5–15)
BUN: 13 mg/dL (ref 6–20)
CO2: 21 mmol/L — ABNORMAL LOW (ref 22–32)
Calcium: 9.2 mg/dL (ref 8.9–10.3)
Chloride: 105 mmol/L (ref 98–111)
Creatinine, Ser: 0.63 mg/dL (ref 0.44–1.00)
GFR, Estimated: >60 mL/min (ref >60)
Glucose, Bld: 72 mg/dL (ref 70–99)
Potassium: 4.7 mmol/L (ref 3.5–5.1)
Sodium: 135 mmol/L (ref 135–145)
Total Bilirubin: 0.4 mg/dL (ref 0.3–1.2)
Total Protein: 6 g/dL — ABNORMAL LOW (ref 6.5–8.1)

## 2020-08-17 LAB — CBC
HCT: 38.2 % (ref 36.0–46.0)
HCT: 39.2 % (ref 36.0–46.0)
Hemoglobin: 12.5 g/dL (ref 12.0–15.0)
Hemoglobin: 12.9 g/dL (ref 12.0–15.0)
MCH: 29.3 pg (ref 26.0–34.0)
MCH: 29.6 pg (ref 26.0–34.0)
MCHC: 32.7 g/dL (ref 30.0–36.0)
MCHC: 32.9 g/dL (ref 30.0–36.0)
MCV: 89.5 fL (ref 80.0–100.0)
MCV: 89.9 fL (ref 80.0–100.0)
Platelets: 277 10*3/uL (ref 150–400)
Platelets: 274 10*3/uL (ref 150–400)
RBC: 4.27 MIL/uL (ref 3.87–5.11)
RBC: 4.36 MIL/uL (ref 3.87–5.11)
RDW: 13.9 % (ref 11.5–15.5)
RDW: 13.8 % (ref 11.5–15.5)
WBC: 10.1 10*3/uL (ref 4.0–10.5)
WBC: 15.6 10*3/uL — ABNORMAL HIGH (ref 4.0–10.5)
nRBC: 0 % (ref 0.0–0.2)
nRBC: 0 % (ref 0.0–0.2)

## 2020-08-17 LAB — URINALYSIS, ROUTINE W REFLEX MICROSCOPIC
Bilirubin Urine: NEGATIVE
Glucose, UA: NEGATIVE mg/dL
Hgb urine dipstick: NEGATIVE
Ketones, ur: NEGATIVE mg/dL
Leukocytes,Ua: NEGATIVE
Nitrite: NEGATIVE
Protein, ur: >=300 mg/dL — AB
Specific Gravity, Urine: 1.012 (ref 1.005–1.030)
pH: 7 (ref 5.0–8.0)

## 2020-08-17 LAB — TYPE AND SCREEN
ABO/RH(D): O POS
Antibody Screen: NEGATIVE

## 2020-08-17 LAB — RESP PANEL BY RT-PCR (FLU A&B, COVID) ARPGX2
Influenza A by PCR: NEGATIVE
Influenza B by PCR: NEGATIVE
SARS Coronavirus 2 by RT PCR: NEGATIVE

## 2020-08-17 LAB — PROTEIN / CREATININE RATIO, URINE
Creatinine, Urine: 55.29 mg/dL
Protein Creatinine Ratio: 6.46 mg/mg{Cre} — ABNORMAL HIGH (ref 0.00–0.15)
Total Protein, Urine: 357 mg/dL

## 2020-08-17 LAB — GROUP B STREP BY PCR: Group B strep by PCR: NEGATIVE

## 2020-08-17 MED ORDER — WITCH HAZEL-GLYCERIN EX PADS: 1.0000 "application " | MEDICATED_PAD | CUTANEOUS | Status: DC | PRN

## 2020-08-17 MED ORDER — PRENATAL MULTIVITAMIN CH
1.0000 | ORAL_TABLET | Freq: Every day | ORAL | Status: DC
  Administered 2020-08-18 – 2020-08-19 (×2): 1 via ORAL
  Filled 2020-08-17 (×2): qty 1

## 2020-08-17 MED ORDER — LABETALOL HCL 5 MG/ML IV SOLN
80.0000 mg | INTRAVENOUS | Status: DC | PRN
  Administered 2020-08-17: 80 mg via INTRAVENOUS
  Filled 2020-08-17: qty 16

## 2020-08-17 MED ORDER — EPHEDRINE 5 MG/ML INJ: 10.0000 mg | INTRAVENOUS | Status: DC | PRN

## 2020-08-17 MED ORDER — NIFEDIPINE 10 MG PO CAPS: 20.0000 mg | ORAL_CAPSULE | ORAL | Status: DC | PRN

## 2020-08-17 MED ORDER — LABETALOL HCL 5 MG/ML IV SOLN
40.0000 mg | INTRAVENOUS | Status: DC | PRN
  Administered 2020-08-17: 40 mg via INTRAVENOUS
  Filled 2020-08-17: qty 8

## 2020-08-17 MED ORDER — IBUPROFEN 600 MG PO TABS
600.0000 mg | ORAL_TABLET | Freq: Four times a day (QID) | ORAL | Status: DC
  Administered 2020-08-18 – 2020-08-20 (×10): 600 mg via ORAL
  Filled 2020-08-17 (×10): qty 1

## 2020-08-17 MED ORDER — PHENYLEPHRINE 40 MCG/ML (10ML) SYRINGE FOR IV PUSH (FOR BLOOD PRESSURE SUPPORT): 80.0000 ug | PREFILLED_SYRINGE | INTRAVENOUS | Status: DC | PRN

## 2020-08-17 MED ORDER — LACTATED RINGERS IV SOLN
500.0000 mL | Freq: Once | INTRAVENOUS | Status: AC
  Administered 2020-08-17: 500 mL via INTRAVENOUS

## 2020-08-17 MED ORDER — FENTANYL CITRATE (PF) 100 MCG/2ML IJ SOLN: 50.0000 ug | INTRAMUSCULAR | Status: DC | PRN

## 2020-08-17 MED ORDER — OXYTOCIN-SODIUM CHLORIDE 30-0.9 UT/500ML-% IV SOLN
INTRAVENOUS | Status: AC
  Administered 2020-08-17: 1 m[IU]/min via INTRAVENOUS
  Filled 2020-08-17: qty 500

## 2020-08-17 MED ORDER — FAMOTIDINE 20 MG PO TABS: 20.0000 mg | ORAL_TABLET | Freq: Two times a day (BID) | ORAL | Status: DC | PRN

## 2020-08-17 MED ORDER — ACETAMINOPHEN 325 MG PO TABS
650.0000 mg | ORAL_TABLET | ORAL | Status: DC | PRN
  Administered 2020-08-18: 650 mg via ORAL
  Filled 2020-08-17: qty 2

## 2020-08-17 MED ORDER — LORATADINE 10 MG PO TABS
10.0000 mg | ORAL_TABLET | Freq: Every day | ORAL | Status: DC
  Administered 2020-08-17 – 2020-08-20 (×4): 10 mg via ORAL
  Filled 2020-08-17 (×4): qty 1

## 2020-08-17 MED ORDER — OXYTOCIN-SODIUM CHLORIDE 30-0.9 UT/500ML-% IV SOLN: 2.5000 [IU]/h | INTRAVENOUS | Status: DC

## 2020-08-17 MED ORDER — OXYTOCIN BOLUS FROM INFUSION
333.0000 mL | Freq: Once | INTRAVENOUS | Status: AC
  Administered 2020-08-17: 333 mL via INTRAVENOUS

## 2020-08-17 MED ORDER — TERBUTALINE SULFATE 1 MG/ML IJ SOLN: 0.2500 mg | Freq: Once | INTRAMUSCULAR | Status: DC | PRN

## 2020-08-17 MED ORDER — OXYCODONE-ACETAMINOPHEN 5-325 MG PO TABS
2.0000 | ORAL_TABLET | ORAL | Status: DC | PRN
  Administered 2020-08-17: 2 via ORAL
  Filled 2020-08-17: qty 2

## 2020-08-17 MED ORDER — DIBUCAINE (PERIANAL) 1 % EX OINT: 1.0000 "application " | TOPICAL_OINTMENT | CUTANEOUS | Status: DC | PRN

## 2020-08-17 MED ORDER — ONDANSETRON HCL 4 MG PO TABS: 4.0000 mg | ORAL_TABLET | ORAL | Status: DC | PRN

## 2020-08-17 MED ORDER — LACTATED RINGERS IV SOLN
500.0000 mL | INTRAVENOUS | Status: DC | PRN
  Administered 2020-08-17: 1000 mL via INTRAVENOUS

## 2020-08-17 MED ORDER — DIPHENHYDRAMINE HCL 25 MG PO CAPS: 25.0000 mg | ORAL_CAPSULE | Freq: Four times a day (QID) | ORAL | Status: DC | PRN

## 2020-08-17 MED ORDER — MAGNESIUM SULFATE BOLUS VIA INFUSION
4.0000 g | Freq: Once | INTRAVENOUS | Status: AC
  Administered 2020-08-17: 4 g via INTRAVENOUS
  Filled 2020-08-17: qty 1000

## 2020-08-17 MED ORDER — TETANUS-DIPHTH-ACELL PERTUSSIS 5-2.5-18.5 LF-MCG/0.5 IM SUSY: 0.5000 mL | PREFILLED_SYRINGE | Freq: Once | INTRAMUSCULAR | Status: DC

## 2020-08-17 MED ORDER — NIFEDIPINE 10 MG PO CAPS
10.0000 mg | ORAL_CAPSULE | ORAL | Status: DC | PRN
  Administered 2020-08-17: 10 mg via ORAL
  Filled 2020-08-17: qty 1

## 2020-08-17 MED ORDER — SALINE SPRAY 0.65 % NA SOLN
1.0000 | NASAL | Status: DC | PRN
  Administered 2020-08-17: 1 via NASAL
  Filled 2020-08-17: qty 44

## 2020-08-17 MED ORDER — OXYTOCIN-SODIUM CHLORIDE 30-0.9 UT/500ML-% IV SOLN
1.0000 m[IU]/min | INTRAVENOUS | Status: DC
  Administered 2020-08-17: 1 m[IU]/min via INTRAVENOUS

## 2020-08-17 MED ORDER — LABETALOL HCL 5 MG/ML IV SOLN: 40.0000 mg | INTRAVENOUS | Status: DC | PRN

## 2020-08-17 MED ORDER — ONDANSETRON HCL 4 MG/2ML IJ SOLN
4.0000 mg | Freq: Four times a day (QID) | INTRAMUSCULAR | Status: DC | PRN
  Administered 2020-08-17: 4 mg via INTRAVENOUS
  Filled 2020-08-17: qty 2

## 2020-08-17 MED ORDER — SENNOSIDES-DOCUSATE SODIUM 8.6-50 MG PO TABS
2.0000 | ORAL_TABLET | ORAL | Status: DC
  Administered 2020-08-18 – 2020-08-20 (×3): 2 via ORAL
  Filled 2020-08-17 (×3): qty 2

## 2020-08-17 MED ORDER — NIFEDIPINE ER OSMOTIC RELEASE 30 MG PO TB24
30.0000 mg | ORAL_TABLET | Freq: Every day | ORAL | Status: DC
  Administered 2020-08-17 – 2020-08-19 (×3): 30 mg via ORAL
  Filled 2020-08-17 (×3): qty 1

## 2020-08-17 MED ORDER — MAGNESIUM SULFATE 40 GM/1000ML IV SOLN
2.0000 g/h | INTRAVENOUS | Status: AC
  Administered 2020-08-17 – 2020-08-18 (×2): 2 g/h via INTRAVENOUS
  Filled 2020-08-17 (×2): qty 1000

## 2020-08-17 MED ORDER — LACTATED RINGERS IV SOLN: INTRAVENOUS | Status: DC

## 2020-08-17 MED ORDER — FENTANYL-BUPIVACAINE-NACL 0.5-0.125-0.9 MG/250ML-% EP SOLN
12.0000 mL/h | EPIDURAL | Status: DC | PRN
  Filled 2020-08-17: qty 250

## 2020-08-17 MED ORDER — OXYCODONE HCL 5 MG PO TABS
10.0000 mg | ORAL_TABLET | ORAL | Status: DC | PRN
Start: 2020-08-17 — End: 2020-08-20

## 2020-08-17 MED ORDER — OXYTOCIN-SODIUM CHLORIDE 30-0.9 UT/500ML-% IV SOLN: 1.0000 m[IU]/min | INTRAVENOUS | Status: DC

## 2020-08-17 MED ORDER — ONDANSETRON HCL 4 MG/2ML IJ SOLN: 4.0000 mg | INTRAMUSCULAR | Status: DC | PRN

## 2020-08-17 MED ORDER — COCONUT OIL OIL
1.0000 "application " | TOPICAL_OIL | Status: DC | PRN
  Administered 2020-08-18: 1 via TOPICAL

## 2020-08-17 MED ORDER — BENZOCAINE-MENTHOL 20-0.5 % EX AERO
1.0000 "application " | INHALATION_SPRAY | CUTANEOUS | Status: DC | PRN
  Administered 2020-08-18: 1 via TOPICAL
  Filled 2020-08-17: qty 56

## 2020-08-17 MED ORDER — PENICILLIN G POT IN DEXTROSE 60000 UNIT/ML IV SOLN
3.0000 10*6.[IU] | INTRAVENOUS | Status: DC
  Administered 2020-08-17 (×2): 3 10*6.[IU] via INTRAVENOUS
  Filled 2020-08-17 (×2): qty 50

## 2020-08-17 MED ORDER — SOD CITRATE-CITRIC ACID 500-334 MG/5ML PO SOLN: 30.0000 mL | ORAL | Status: DC | PRN

## 2020-08-17 MED ORDER — SIMETHICONE 80 MG PO CHEW: 80.0000 mg | CHEWABLE_TABLET | ORAL | Status: DC | PRN

## 2020-08-17 MED ORDER — OXYCODONE-ACETAMINOPHEN 5-325 MG PO TABS: 2.0000 | ORAL_TABLET | ORAL | Status: DC | PRN

## 2020-08-17 MED ORDER — OXYCODONE-ACETAMINOPHEN 5-325 MG PO TABS: 1.0000 | ORAL_TABLET | ORAL | Status: DC | PRN

## 2020-08-17 MED ORDER — ACETAMINOPHEN 325 MG PO TABS: 650.0000 mg | ORAL_TABLET | ORAL | Status: DC | PRN

## 2020-08-17 MED ORDER — OXYCODONE HCL 5 MG PO TABS
5.0000 mg | ORAL_TABLET | ORAL | Status: DC | PRN
  Administered 2020-08-19: 5 mg via ORAL
  Filled 2020-08-17: qty 1

## 2020-08-17 MED ORDER — HYDRALAZINE HCL 20 MG/ML IJ SOLN
10.0000 mg | INTRAMUSCULAR | Status: DC | PRN
  Administered 2020-08-17: 10 mg via INTRAVENOUS
  Filled 2020-08-17: qty 1

## 2020-08-17 MED ORDER — DIPHENHYDRAMINE HCL 50 MG/ML IJ SOLN: 12.5000 mg | INTRAMUSCULAR | Status: DC | PRN

## 2020-08-17 MED ORDER — BETAMETHASONE SOD PHOS & ACET 6 (3-3) MG/ML IJ SUSP
12.0000 mg | INTRAMUSCULAR | Status: DC
  Administered 2020-08-17: 12 mg via INTRAMUSCULAR
  Filled 2020-08-17: qty 5

## 2020-08-17 MED ORDER — LABETALOL HCL 5 MG/ML IV SOLN
20.0000 mg | INTRAVENOUS | Status: DC | PRN
  Administered 2020-08-17: 20 mg via INTRAVENOUS
  Filled 2020-08-17: qty 4

## 2020-08-17 MED ORDER — LIDOCAINE HCL (PF) 1 % IJ SOLN
30.0000 mL | INTRAMUSCULAR | Status: AC | PRN
  Administered 2020-08-17: 30 mL via SUBCUTANEOUS
  Filled 2020-08-17: qty 30

## 2020-08-17 MED ORDER — SODIUM CHLORIDE 0.9 % IV SOLN
5.0000 10*6.[IU] | Freq: Once | INTRAVENOUS | Status: AC
  Administered 2020-08-17: 5 10*6.[IU] via INTRAVENOUS
  Filled 2020-08-17: qty 5

## 2020-08-17 NOTE — MAU Provider Note (Signed)
History     CSN: [id number]  Arrival date and time: 08/17/20 0746   Event Date/Time   First Provider Initiated Contact with Patient 08/17/20 [phone]      Chief Complaint  Patient presents with  . Abdominal Pain   HPI Madeline [name] is a 30 y.o. G3P2002 at [redacted]w[redacted]d who presents to MAU with chief complaint of gallbladder pain in the setting of previously diagnosed gallstones. She endorses constant pain inferior to her sternum on the middle of her abdomen. At 0100 today her pain score was 10/10. She took 1G of Tylenol around 0130. Her pain score on arrival to MAU is 3-4/10.  Patient denies history of hypertension. She denies headache, visual disturbances, RUQ/epigastric pain, new onset swelling or weight gain. She denies vaginal bleeding, leaking of fluid, decreased fetal movement, fever, falls, or recent illness.   Patient receives [facility] with [facility] She states baby has been persistently breech but she felt "a big movement and she probably flipped on Friday.  OB History    Gravida  3   Para  2   Term  2   Preterm  0   AB  0   Living  2     SAB  0   IAB  0   Ectopic  0   Multiple  0   Live Births  2           Past Medical History:  Diagnosis Date  . Gall stones   . Headache(784.0)    not requiring meds  . Infection    UTI  . MRSA infection    reports hx- ? 4 yrs ago  . Pyelonephritis complicating pregnancy in first trimester, antepartum     Past Surgical History:  Procedure Laterality Date  . NO PAST SURGERIES      Family History  Problem Relation Age of Onset  . Asthma Brother   . Restless legs syndrome Mother   . Restless legs syndrome Sister   . Heart disease Father   . Stroke Paternal Grandmother     Social History   Tobacco Use  . Smoking status: Never Smoker  . Smokeless tobacco: Never Used  Vaping Use  . Vaping Use: Never used  Substance Use Topics  . Alcohol use: No  . Drug use: No    Allergies: No Known  Allergies  Medications Prior to Admission  Medication Sig Dispense Refill Last Dose  . acetaminophen (TYLENOL) 500 MG tablet Take 1,000 mg by mouth every 6 (six) hours as needed for mild pain or fever.   08/17/2020 at 0130  . famotidine (PEPCID) 20 MG tablet Take 20 mg by mouth 2 (two) times daily.   08/17/2020 at 0130  . ibuprofen (ADVIL,MOTRIN) 600 MG tablet Take 1 tablet (600 mg total) by mouth every 6 (six) hours as needed for cramping. 60 tablet 1   . lanolin OINT Apply 1 application topically 2 (two) times daily. 28 g 1   . ondansetron (ZOFRAN ODT) 4 MG disintegrating tablet Take 1 tablet (4 mg total) by mouth every 8 (eight) hours as needed for nausea or vomiting. 10 tablet 0   . Prenatal Vit-Fe Fumarate-FA (PRENATAL MULTIVITAMIN) TABS tablet Take 1 tablet by mouth daily at 12 noon.     . ranitidine (ZANTAC) 150 MG tablet Take 150 mg by mouth 2 (two) times daily as needed for heartburn.       Review of Systems  Eyes: Negative for visual disturbance.  Respiratory: Negative for shortness  of breath.   Gastrointestinal: Positive for abdominal pain.       Upper mid abdomen 1cm inferior to sternum  Musculoskeletal: Negative for back pain.  Neurological: Negative for headaches.  All other systems reviewed and are negative.  Physical Exam   Blood pressure (!) 158/104, pulse 65, temperature 98.2 F (36.8 C), temperature source Oral, resp. rate 16, height 5\' 2"  (1.575 m), weight 99.6 kg, SpO2 98 %, unknown if currently breastfeeding.  Physical Exam Vitals and nursing note reviewed. Exam conducted with a chaperone present.  Constitutional:      Appearance: She is well-developed. She is not ill-appearing.  Cardiovascular:     Rate and Rhythm: Normal rate.     Heart sounds: Normal heart sounds.  Pulmonary:     Effort: Pulmonary effort is normal.     Breath sounds: Normal breath sounds.  Abdominal:     Palpations: Abdomen is soft.     Tenderness: There is no abdominal tenderness. There  is no right CVA tenderness or left CVA tenderness.  Skin:    Capillary Refill: Capillary refill takes less than 2 seconds.  Neurological:     Mental Status: She is alert and oriented to person, place, and time.  Psychiatric:        Mood and Affect: Mood normal.        Behavior: Behavior normal.     MAU Course  Procedures   --Reactive tracing: baseline 130, mod var, + accels, no decels --Toco: UI, rare ctx  Patient Vitals for the past 24 hrs:  BP Temp Temp src Pulse Resp SpO2 Height Weight  08/17/20 0901 (!) 158/104 -- -- 65 -- 98 % -- --  08/17/20 0846 (!) 171/90 -- -- (!) 56 -- 99 % -- --  08/17/20 0836 (!) 159/104 -- -- 63 -- 98 % -- --  08/17/20 0818 (!) 167/113 -- -- 67 -- 97 % -- --  08/17/20 0802 (!) 178/98 98.2 F (36.8 C) Oral (!) 56 16 98 % -- --  08/17/20 0757 -- -- -- -- -- -- 5\' 2"  (1.575 m) 99.6 kg   Orders Placed This Encounter  Procedures  . 10/17/20 ABDOMEN LIMITED RUQ (LIVER/GB)  . Urinalysis, Routine w reflex microscopic Urine, Clean Catch  . Protein / creatinine ratio, urine  . CBC  . Comprehensive metabolic panel  . Measure blood pressure  . Notify Physician  . Measure blood pressure  . Blood draw with IV start   ABDOMEN LIMITED RUQ (LIVER/GB)  Result Date: 08/17/2020 CLINICAL DATA:  Upper abdominal pain EXAM: ULTRASOUND ABDOMEN LIMITED RIGHT UPPER QUADRANT COMPARISON:  Ultrasound right upper quadrant June 02, 2013 FINDINGS: Gallbladder: Within the gallbladder, there are multiple echogenic foci which move and shadow as is expected with cholelithiasis. Largest individual gallstone measures 8 mm in length. No gallbladder wall thickening or pericholecystic fluid. No sonographic Murphy sign noted by sonographer. Common bile duct: Diameter: 2 mm. No intrahepatic or extrahepatic biliary duct dilatation. Liver: No focal lesion identified. Within normal limits in parenchymal echogenicity. Portal vein is patent on color Doppler imaging with normal direction of  blood flow towards the liver. Other: None. IMPRESSION: Cholelithiasis. No gallbladder wall thickening or pericholecystic fluid. Study otherwise unremarkable. Electronically Signed   By: 10/17/2020 III M.D.   On: 08/17/2020 08:51   Results for orders placed or performed during the hospital encounter of 08/17/20 (from the past 24 hour(s))  CBC     Status: None   Collection Time: 08/17/20  8:27 AM  Result Value Ref Range   WBC 10.1 4.0 - 10.5 K/uL   RBC 4.27 3.87 - 5.11 MIL/uL   Hemoglobin 12.5 12.0 - 15.0 g/dL   HCT 73.4 28.7 - 68.1 %   MCV 89.5 80.0 - 100.0 fL   MCH 29.3 26.0 - 34.0 pg   MCHC 32.7 30.0 - 36.0 g/dL   RDW 15.7 26.2 - 03.5 %   Platelets 277 150 - 400 K/uL   nRBC 0.0 0.0 - 0.2 %  Comprehensive metabolic panel     Status: Abnormal   Collection Time: 08/17/20  8:27 AM  Result Value Ref Range   Sodium 135 135 - 145 mmol/L   Potassium 4.7 3.5 - 5.1 mmol/L   Chloride 105 98 - 111 mmol/L   CO2 21 (L) 22 - 32 mmol/L   Glucose, Bld 72 70 - 99 mg/dL   BUN 13 6 - 20 mg/dL   Creatinine, Ser 5.97 0.44 - 1.00 mg/dL   Calcium 9.2 8.9 - 41.6 mg/dL   Total Protein 6.0 (L) 6.5 - 8.1 g/dL   Albumin 2.7 (L) 3.5 - 5.0 g/dL   AST 24 15 - 41 U/L   ALT 19 0 - 44 U/L   Alkaline Phosphatase 114 38 - 126 U/L   Total Bilirubin 0.4 0.3 - 1.2 mg/dL   GFR, Estimated >38 >45 mL/min   Anion gap 9 5 - 15  Urinalysis, Routine w reflex microscopic Urine, Clean Catch     Status: Abnormal   Collection Time: 08/17/20  8:32 AM  Result Value Ref Range   Color, Urine YELLOW YELLOW   APPearance CLEAR CLEAR   Specific Gravity, Urine 1.012 1.005 - 1.030   pH 7.0 5.0 - 8.0   Glucose, UA NEGATIVE NEGATIVE mg/dL   Hgb urine dipstick NEGATIVE NEGATIVE   Bilirubin Urine NEGATIVE NEGATIVE   Ketones, ur NEGATIVE NEGATIVE mg/dL   Protein, ur >=364 (A) NEGATIVE mg/dL   Nitrite NEGATIVE NEGATIVE   Leukocytes,Ua NEGATIVE NEGATIVE   RBC / HPF 0-5 0 - 5 RBC/hpf   WBC, UA 0-5 0 - 5 WBC/hpf   Bacteria, UA  RARE (A) NONE SEEN   Squamous Epithelial / LPF 0-5 0 - 5  Protein / creatinine ratio, urine     Status: None (Preliminary result)   Collection Time: 08/17/20  8:32 AM  Result Value Ref Range   Creatinine, Urine 55.29 mg/dL   Total Protein, Urine PENDING mg/dL   Protein Creatinine Ratio PENDING 0.00 - 0.15 mg/mg[Cre]  Type and screen [facility]     Status: None (Preliminary result)   Collection Time: 08/17/20  9:20 AM  Result Value Ref Range   ABO/RH(D) PENDING    Antibody Screen PENDING    Sample Expiration      08/20/2020,2359 Performed at [facility], [address] [place], [place] [address]     Assessment and Plan  --30 y.o. [phone] at [redacted]w[redacted]d  --New onset Severe PEC --Reactive tracing, cervix 1/thick/-2 --GBS unknown --Cephalic confirmed with BSUS --Labetalol Protocol initiated after second severe range BP per MAU policy --Discussed with Dr. Debroah Loop, agrees with my recommendation for admission --Dr. Chestine Spore notified at 0900, report given, care assumed --Per Dr. Chestine Spore, admit to L&D  Calvert Cantor, CNM 08/17/2020, 10:36 AM

## 2020-08-17 NOTE — H&P (Signed)
30 y.o. [id number] @ 20w5dpresents to MAU with upper abdominal pain that she attributed to her known gall stones.  She had a RUQ UKoreathat showed no evidence of blockage or gallbladder wall thickening.  In MAU, she was noted to have new onset severe range blood pressures.  She received IV labetalol 20, 40, 80 mg.  She received 191mIV hydralazine immediately prior to transfer to L&D.  Upon arrival to L&D, it was noted that her IV was infiltrated, so it was unclear how much of the hydralazine she had received.  It was challenging to obtain a second IV, so she was switched to the PO procaria protocol so as to not delay treatment of her hypertension.  She complained of persistent epigastric pain, but denied headache, visual changes or right upper quadrant pain.  She has no history of elevated blood pressures in the current pregnancy or with her first two pregnancies.  Otherwise has good fetal movement and no bleeding.  Pregnancy complicated by: 1. Pre-pregnancy BMI 33.6.  Growth USKoreat 35 weeks: 25.4% (5lb7oz)  Past Medical History:  Diagnosis Date   Gall stones    Headache(784.0)    not requiring meds   Infection    UTI   MRSA infection    reports hx- ? 4 yrs ago   Pyelonephritis complicating pregnancy in first trimester, antepartum     Past Surgical History:  Procedure Laterality Date   NO PAST SURGERIES      OB History  Gravida Para Term Preterm AB Living  '3 2 2 ' 0 0 2  SAB IAB Ectopic Multiple Live Births  0 0 0 0 2    # Outcome Date GA Lbr Len/2nd Weight Sex Delivery Anes PTL Lv  3 Current           2 Term 09/23/15 [id number] / 00:16 3620 g M Vag-Spont EPI  LIV     Birth Comments: WNL  1 Term 03/14/13 [id number] / 00:29 3524 g M Vag-Spont EPI  LIV    Social History   Socioeconomic History   Marital status: Married    Spouse name: Not on file   Number of children: Not on file   Years of education: Not on file   Highest education level: Not on file  Occupational History   Not on  file  Tobacco Use   Smoking status: Never Smoker   Smokeless tobacco: Never Used  Vaping Use   Vaping Use: Never used  Substance and Sexual Activity   Alcohol use: No   Drug use: No   Sexual activity: Yes    Birth control/protection: None  Other Topics Concern   Not on file  Social History Narrative   Not on file   Social Determinants of Health   Financial Resource Strain: Not on file  Food Insecurity: Not on file  Transportation Needs: Not on file  Physical Activity: Not on file  Stress: Not on file  Social Connections: Not on file  Intimate Partner Violence: Not on file   Patient has no known allergies.    Prenatal Transfer Tool  Maternal Diabetes: No Genetic Screening: Declined Maternal Ultrasounds/Referrals: Normal Fetal Ultrasounds or other Referrals:  None Maternal Substance Abuse:  No Significant Maternal Medications:  None Significant Maternal Lab Results: Other:  GBS unknown  ABO, Rh: --/--/O POS (04/12 0920) Antibody: NEG (04/12 0920) Rubella:  Non-immune RPR:   NR HBsAg:   Negative HIV:   Negative GBS:   Unknown  Vitals:   08/17/20 1012 08/17/20 1030  BP: (!) 168/107 (!) 150/99  Pulse: 60 66  Resp:  16  Temp:  98.3 F (36.8 C)  SpO2:  99%     General:  NAD Abdomen:  soft, gravid, EFW 5.6-6# Ex:  no edema, DTRs 2+ SVE:  1.5/50/-3/soft/posterior.  Foley bulb placed and filled with 63m fluid FHTs:  130s, moderate variability, occasional variable decelerations, + accelerations Toco:  rare  Ultrasound:  08/10/20--efw 5lb 7oz, 25%, fluid was normal with AFI 9, DVP 2.8cm  A/P   30y.o. [id number] with preeclampsia, severe by blood pressure and features Admit to L&D IOL--foley bulb placed, will start low dose pitocin Severe preeclampsia--magnesium sulfate now for seizure prophylaxis and to continue 24 hours postpartum.  Urine p:c >6.  LFTs / platelets normal.   Prematurity--As GA is < 37 weeks and cervix is unfavorable, I anticipate  that it will take > 12 hours for delivery to occur.  Will initiate a betamethasone course Rubella non-immune--MMR after delivery FSR/ vtx/ GBS unknown--will start pcn for gbs prophylaxis  [place]

## 2020-08-17 NOTE — Progress Notes (Signed)
Patient seen and examined.  RN was unsure if ROM occurred with FB expulsion.  She is on pitocin 66mU.  Just prior / after FB expulsion, EFM was 120s, moderate variability, category 1 without decelerations.  Over last 20 minutes, recurrent decelerations.  Toco not tracing consistently.  Small forebag on exam--ROM with clear fluid.  IUPC placed.  Will monitor closely EFM.  Discussed with patient and partner if recurrent decelerations unresponsive to resuscitative measures remote from delivery, may need to proceed with cesarean delivery.

## 2020-08-17 NOTE — MAU Note (Signed)
Madeline [name] is a 30 y.o. at [redacted]w[redacted]d here in MAU reporting: mid upper abdominal pain since Friday. States pain got worse around 0100 but now it has eased up some. Pt has hx of gallstones. Denies ctx, LOF, and bleeding. +FM  Onset of complaint: since Friday  Pain score: 3/10  Vitals:   08/17/20 0802  BP: (!) 178/98  Pulse: (!) 56  Resp: 16  Temp: 98.2 F (36.8 C)  SpO2: 98%     FHT:132  Lab orders placed from triage: UA

## 2020-08-17 NOTE — Lactation Note (Signed)
This note was copied from a baby's chart. Lactation Consultation Note  Patient Name: Girl Jordynn Marcella [id number] Date: 08/17/2020 Reason for consult: L&D Initial assessment;Late-preterm 34-36.6wks Age:32 hours  Visited with mom of 1 hours old LPI female, she's a P3 and experienced BF; she also reported (++) breast changes during the pregnancy. When [facility] assisted with hand expression, no colostrum was noted at this point. Mom and baby doing STS when entered the room, offered assistance with latch and mom agreed to try, baby started crying as soon as he got off mom's breast.  LC took baby to the left breast STS in cross cradle position and she was able to easily latch and started sucking. However, non nutritive sucking pattern was observed with short outbursts and no audible swallows noted. Baby started falling asleep around the 7 minutes, LC returned baby back to mother's chest for more STS care.  Reviewed normal newborn behavior, feeding cues, lactogenesis II and LPI policy. LC informed mom of her options for supplementation (if needed) and she voiced she'll be OK either way with donor milk or formula if she can't pump enough EBM. She brought her hand pump from home, explained to mom that the [facility] care unit can also provide with a DEBP.  Feeding plan:  1. Encouraged mom to feed baby STS 8-12 times/24 hours or sooner if feeding cues are present 2. Hand expression was also encouraged 3. Pumping every 3 hours was also strongly encouraged  4. Supplementation every 3 hours with EBM/donor milk/formula was also recommended  No literature provided due to the nature of this L&D consultation. No support person in mom's room at the time of [facility] consultation. Mom reported all questions and concerns were answered, she's aware of LC OP services and will call PRN.   Maternal Data Has patient been taught Hand Expression?: Yes Does the patient have breastfeeding experience prior to this delivery?:  Yes How long did the patient breastfeed?: BF 1st one for two weeks and 2nd one for 6 months  Feeding Mother's Current Feeding Choice: Breast Milk  LATCH Score Latch: Grasps breast easily, tongue down, lips flanged, rhythmical sucking.  Audible Swallowing: None  Type of Nipple: Everted at rest and after stimulation  Comfort (Breast/Nipple): Soft / non-tender  Hold (Positioning): Assistance needed to correctly position infant at breast and maintain latch.  LATCH Score: 7   Lactation Tools Discussed/Used    Interventions Interventions: Breast feeding basics reviewed;Assisted with latch;Skin to skin;Breast massage;Hand express;Breast compression;Adjust position;Support pillows  Discharge Pump: Personal (Medela DEBP at home) [facility] Program: Yes  Consult Status Consult Status: Follow-up Date: 08/18/20 Follow-up type: In-patient    Enslee Bibbins Venetia Constable 08/17/2020, 9:35 PM

## 2020-08-18 LAB — CBC
HCT: 35.1 % — ABNORMAL LOW (ref 36.0–46.0)
Hemoglobin: 11.7 g/dL — ABNORMAL LOW (ref 12.0–15.0)
MCH: 29.5 pg (ref 26.0–34.0)
MCHC: 33.3 g/dL (ref 30.0–36.0)
MCV: 88.4 fL (ref 80.0–100.0)
Platelets: 256 10*3/uL (ref 150–400)
RBC: 3.97 MIL/uL (ref 3.87–5.11)
RDW: 14.2 % (ref 11.5–15.5)
WBC: 14.9 10*3/uL — ABNORMAL HIGH (ref 4.0–10.5)
nRBC: 0 % (ref 0.0–0.2)

## 2020-08-18 LAB — COMPREHENSIVE METABOLIC PANEL
ALT: 16 U/L (ref 0–44)
AST: 20 U/L (ref 15–41)
Albumin: 2.5 g/dL — ABNORMAL LOW (ref 3.5–5.0)
Alkaline Phosphatase: 104 U/L (ref 38–126)
Anion gap: 6 (ref 5–15)
BUN: 13 mg/dL (ref 6–20)
CO2: 22 mmol/L (ref 22–32)
Calcium: 7.4 mg/dL — ABNORMAL LOW (ref 8.9–10.3)
Chloride: 107 mmol/L (ref 98–111)
Creatinine, Ser: 0.76 mg/dL (ref 0.44–1.00)
GFR, Estimated: >60 mL/min (ref >60)
Glucose, Bld: 119 mg/dL — ABNORMAL HIGH (ref 70–99)
Potassium: 4.6 mmol/L (ref 3.5–5.1)
Sodium: 135 mmol/L (ref 135–145)
Total Bilirubin: 0.3 mg/dL (ref 0.3–1.2)
Total Protein: 5.8 g/dL — ABNORMAL LOW (ref 6.5–8.1)

## 2020-08-18 LAB — RPR: RPR Ser Ql: NONREACTIVE

## 2020-08-18 NOTE — Progress Notes (Signed)
Patient is eating, ambulating, voiding.  Pain control is good.  Appropriate lochia, no complaints but dizziness with magnesium IV.  Baby in NICU.  Denies HA/vision change/RUQ pain or other problems.  Vitals:   08/18/20 0530 08/18/20 0650 08/18/20 0728 08/18/20 1142  BP:   (!) 143/90 (!) 158/93  Pulse:   71 97  Resp: 18 16 16 16   Temp:   98.1 F (36.7 C) 98.1 F (36.7 C)  TempSrc:   Oral Oral  SpO2:   97% 98%  Weight:      Height:        Fundus firm Ext: no calf tendernss  Lab Results  Component Value Date   WBC 14.9 (H) 08/18/2020   HGB 11.7 (L) 08/18/2020   HCT 35.1 (L) 08/18/2020   MCV 88.4 08/18/2020   PLT 256 08/18/2020    --/--/O POS (04/12 0920)  A/P Post partum day 1, with severe PEC at presentation to MAU Required IV labetalol 20,40,80 and hydralazine in MAU, f/u oral procardia and was started on MgSO4, sure is currently on Procadia XL 30mg  qd and BPs are normal to mild range.  Has been >24 hrs since severe BP.  ALT/AST/PLT all wnl, UOP appropriate. MgSO4 will discontinue tonight approx 8:30pm Baby in NICU.  Routine care. 08-13-1976

## 2020-08-18 NOTE — Progress Notes (Addendum)
Patient screened out for psychosocial assessment since none of the following apply:  Psychosocial stressors documented in mother or baby's chart  Gestation less than 32 weeks  Code at delivery   Infant with anomalies Please contact the Clinical Social Worker if specific needs arise, by MOB's request, or if MOB scores greater than 9/yes to question 10 on Edinburgh Postpartum Depression Screen.  Madeline Pho, LCSW Clinical Social Worker [facility] Cell#: [phone]     

## 2020-08-18 NOTE — Lactation Note (Signed)
This note was copied from a baby's chart. Lactation Consultation Note  Patient Name: Madeline [name] Date: 08/18/2020 Reason for consult: Follow-up assessment;Mother's request;NICU baby;Late-preterm 34-36.6wks;Infant < 6lbs Age:32 hours   Mom stated since infant went to NICU she pumped once for 5 minutes and hour ago for 25 minutes. LC assessed flange size and 27 appeared too big, 24 better fit. Mom stated 24 felt fine she just needed suction turned down. Mom aware to pump q 3hrs for 15 minutes on the initiation phase.   LC assisted Mom applying coconut oil for nipple care. Mom aware she can call RN for any EBM to take to NICU. Mom able to collect some drops from the right breast but nothing so far from the left.   Mom encouraged to continue pumping frequency described above to increase her letdown.  All questions answered at the end of the visit.   Maternal Data    Feeding Mother's Current Feeding Choice: Breast Milk and Formula  LATCH Score                    Lactation Tools Discussed/Used Tools: Pump;Flanges Flange Size: 24 Breast pump type: Double-Electric Breast Pump Pump Education: Setup, frequency, and cleaning;Milk Storage Reason for Pumping: increase stimulation Pumping frequency: eery 3 hrs for 15 minutes  Interventions Interventions: Breast feeding basics reviewed;DEBP;Coconut oil  Discharge    Consult Status Consult Status: Follow-up Date: 08/19/20 Follow-up type: In-patient    Francena Zender  Nicholson-Springer 08/18/2020, 6:17 PM

## 2020-08-19 MED ORDER — MEASLES, MUMPS & RUBELLA VAC IJ SOLR
0.5000 mL | Freq: Once | INTRAMUSCULAR | Status: AC
  Administered 2020-08-19: 0.5 mL via SUBCUTANEOUS
  Filled 2020-08-19: qty 0.5

## 2020-08-19 MED ORDER — NIFEDIPINE ER OSMOTIC RELEASE 30 MG PO TB24
60.0000 mg | ORAL_TABLET | Freq: Every day | ORAL | Status: DC
  Administered 2020-08-20: 60 mg via ORAL
  Filled 2020-08-19: qty 2

## 2020-08-19 MED ORDER — NIFEDIPINE ER OSMOTIC RELEASE 30 MG PO TB24
30.0000 mg | ORAL_TABLET | Freq: Once | ORAL | Status: AC
  Administered 2020-08-19: 30 mg via ORAL
  Filled 2020-08-19: qty 1

## 2020-08-19 NOTE — Progress Notes (Signed)
Vitals:   08/19/20 0448 08/19/20 0450 08/19/20 0833 08/19/20 1214  BP: (!) 147/89  (!) 141/99 129/81  Pulse: 76  74 79  Resp: 17  18 18   Temp: 97.6 F (36.4 C)  98 F (36.7 C) 98 F (36.7 C)  TempSrc: Oral  Oral Oral  SpO2: 100% 100% 99% 98%  Weight:      Height:        BPs stable post increase in dose of Procardia.

## 2020-08-19 NOTE — Progress Notes (Signed)
Patient is eating, ambulating, voiding.  Pain control is good.  Vitals:   08/18/20 1600 08/18/20 1957 08/18/20 2318 08/19/20 0448  BP:  (!) 143/94 (!) 144/86 (!) 147/89  Pulse:  96 70 76  Resp:  _0 Temp:  98.3 F (36.8 C) 98.2 F (36.8 C) 97.6 F (36.4 C)  TempSrc:  Oral Oral Oral  SpO2: 93% 98% 100% 100%  Weight:      Height:        Fundus firm Perineum without swelling.  Lab Results  Component Value Date   WBC 14.9 (H) 08/18/2020   HGB 11.7 (L) 08/18/2020   HCT 35.1 (L) 08/18/2020   MCV 88.4 08/18/2020   PLT 256 08/18/2020    --/--/O POS (04/12 0920)/R Nonimmune   A/P Post partum day 1, with severe PEC at presentation to MAU Required IV labetalol 20,40,80 and hydralazine in MAU, f/u oral procardia and was started on MgSO4, sure is currently on Procadia XL 65m qd and BPs are normal to mild range.  Has been >24 hrs since severe BP.  ALT/AST/PLT all wnl, UOP appropriate yesterday. MgSO4 will discontinued last night. BPs today are 140s/90s- oral procardia was started yesterday- will increase to 60 mg XL Baby in NICU.  Routine care.  RNI- MMR.  MDaria Pastures

## 2020-08-19 NOTE — Lactation Note (Signed)
This note was copied from a baby's chart. Lactation Consultation Note  Patient Name: Madeline [name] [id number] Date: 08/19/2020 Reason for consult: Follow-up assessment;NICU baby;Early term 37-38.6wks Age:32 hours  Follow up visit to 39 hours old infant of P2 mother. Mother has been using the DEBP for stimulation and supplementation purposes. Mother is collecting ~54mL of EBM. Mother shares she has noted some breast changes that may be consistent with lactogenesis II. Mother may need a larger flange for left nipple but she feels 27 mm is too big. She reports no pain or discomfort but she has noticed it may a little snug. Encouraged mother to switch flange as needed or call LC to fit again.  Talked about pumping for two more minutes when noticing drops after letdown. Discussed that pumping session time will always vary and it will give her stimulation similar to infant feeding from breast. Mother verbalizes in agreement.  Encouraged to request [facility] for any needs, support or questions. Praised mother for her milk supply, effort and dedication.    Maternal Data Does the patient have breastfeeding experience prior to this delivery?: Yes How long did the patient breastfeed?: 6 months  Feeding Mother's Current Feeding Choice: Breast Milk  Lactation Tools Discussed/Used Tools: Pump;Flanges Flange Size: 24 Breast pump type: Double-Electric Breast Pump Reason for Pumping: maternal infant separation Pumping frequency: Q3 Pumped volume: 25 mL  Interventions Interventions: Breast feeding basics reviewed;DEBP;Breast massage;Expressed milk;Education  Consult Status Consult Status: Follow-up Date: 08/20/20 Follow-up type: In-patient    Madeline [name] 08/19/2020, 2:49 PM

## 2020-08-20 LAB — SURGICAL PATHOLOGY

## 2020-08-20 MED ORDER — IBUPROFEN 600 MG PO TABS: 600.0000 mg | ORAL_TABLET | Freq: Four times a day (QID) | ORAL | 0 refills | Status: AC | PRN

## 2020-08-20 MED ORDER — BLOOD PRESSURE KIT: 1.0000 | PACK | Freq: Two times a day (BID) | 0 refills | Status: AC

## 2020-08-20 MED ORDER — OXYCODONE HCL 5 MG PO TABS: 5.0000 mg | ORAL_TABLET | ORAL | 0 refills | Status: AC | PRN

## 2020-08-20 MED ORDER — NIFEDIPINE ER 60 MG PO TB24: 60.0000 mg | ORAL_TABLET | Freq: Every day | ORAL | 1 refills | Status: AC

## 2020-08-20 MED ORDER — ACETAMINOPHEN 325 MG PO TABS: 650.0000 mg | ORAL_TABLET | ORAL | Status: AC | PRN

## 2020-08-20 NOTE — Lactation Note (Signed)
This note was copied from a baby's chart. Lactation Consultation Note LC to [facility] for f/u visit with mother of NICU infant. Mom is pumping 6-7 x day with increasing volume. She has +breast changes but denies engorgement. Today, she is pumping about . Reviewed importance of frequent breast stimulation.  Patient Name: Madeline [name] [id number] Date: 08/20/2020 Reason for consult: NICU baby;Follow-up assessment Age:32 hours  Consult Status Consult Status: Follow-up Follow-up type: In-patient   Elder Negus, MA IBCLC 08/20/2020, 10:01 AM

## 2020-08-20 NOTE — Discharge Summary (Signed)
Postpartum Discharge Summary     Patient Name: Madeline [name] DOB: [birth year] MRN: [id number]  Date of admission: 08/17/2020 Delivery date:08/17/2020  Delivering provider: Jerelyn Charles  Date of discharge: 08/20/2020  Admitting diagnosis: Preeclampsia, severe, third trimester [O14.13] Intrauterine pregnancy: [redacted]w[redacted]d     Secondary diagnosis:  Active Problems:   Preeclampsia, severe, third trimester  Additional problems: Severe preeclampsia    Discharge diagnosis: Preterm Pregnancy Delivered and Preeclampsia (severe)                                              Post partum procedures:none Augmentation: Pitocin and IP Foley Complications: None  Hospital course: Induction of Labor With Vaginal Delivery   30 y.o. yo [phone] at [redacted]w[redacted]d was admitted to the hospital 08/17/2020 for induction of labor.  Indication for induction: Preeclampsia.  Patient had an uncomplicated labor course as follows: Membrane Rupture Time/Date: 4:47 PM ,08/17/2020   Delivery Method:Vaginal, Spontaneous  Episiotomy: None  Lacerations:  1st degree  Details of delivery can be found in separate delivery note.  Patient had a routine postpartum course. Patient is discharged home 08/20/20.  Newborn Data: Birth date:08/17/2020  Birth time:8:25 PM  Gender:Female  Living status:Living  Apgars:9 ,9  Weight:2135 g   Magnesium Sulfate received: Yes: Seizure prophylaxis BMZ received: Yes  - 1 dose Rhophylac:N/A MMR:Yes T-DaP:Given prenatally Flu: No Transfusion:No  Physical exam  Vitals:   08/19/20 2127 08/20/20 0045 08/20/20 0432 08/20/20 0855  BP: 140/90 135/86 136/81 (!) 141/86  Pulse: 88 80 83 82  Resp: $Remo'19 18 18 18  'pVynX$ Temp: 98.1 F (36.7 C) 98.1 F (36.7 C) 97.9 F (36.6 C) 98.1 F (36.7 C)  TempSrc: Oral Oral Oral Oral  SpO2: 100% 96% 100% 100%  Weight:      Height:       General: alert, cooperative and no distress Lochia: appropriate Uterine Fundus: firm Incision: N/A DVT Evaluation: No  evidence of DVT seen on physical exam. Labs: Lab Results  Component Value Date   WBC 14.9 (H) 08/18/2020   HGB 11.7 (L) 08/18/2020   HCT 35.1 (L) 08/18/2020   MCV 88.4 08/18/2020   PLT 256 08/18/2020   CMP Latest Ref Rng & Units 08/18/2020  Glucose 70 - 99 mg/dL 119(H)  BUN 6 - 20 mg/dL 13  Creatinine 0.44 - 1.00 mg/dL 0.76  Sodium 135 - 145 mmol/L 135  Potassium 3.5 - 5.1 mmol/L 4.6  Chloride 98 - 111 mmol/L 107  CO2 22 - 32 mmol/L 22  Calcium 8.9 - 10.3 mg/dL 7.4(L)  Total Protein 6.5 - 8.1 g/dL 5.8(L)  Total Bilirubin 0.3 - 1.2 mg/dL 0.3  Alkaline Phos 38 - 126 U/L 104  AST 15 - 41 U/L 20  ALT 0 - 44 U/L 16   Edinburgh Score: Edinburgh Postnatal Depression Scale Screening Tool 08/18/2020  I have been able to laugh and see the funny side of things. 0  I have looked forward with enjoyment to things. 0  I have blamed myself unnecessarily when things went wrong. 1  I have been anxious or worried for no good reason. 0  I have felt scared or panicky for no good reason. 0  Things have been getting on top of me. 0  I have been so unhappy that I have had difficulty sleeping. 0  I have felt sad or miserable. 1  I have been so unhappy that I have been crying. 0  The thought of harming myself has occurred to me. 0  Edinburgh Postnatal Depression Scale Total 2      After visit meds:  Allergies as of 08/20/2020   No Known Allergies     Medication List    TAKE these medications   acetaminophen 325 MG tablet Commonly known as: Tylenol Take 2 tablets (650 mg total) by mouth every 4 (four) hours as needed (for pain scale < 4). What changed:   medication strength  how much to take  when to take this  reasons to take this   Blood Pressure Kit 1 kit by Does not apply route 2 (two) times daily.   calcium carbonate 500 MG chewable tablet Commonly known as: TUMS - dosed in mg elemental calcium Chew 2 tablets by mouth 3 (three) times daily as needed for indigestion or  heartburn.   famotidine 20 MG tablet Commonly known as: PEPCID Take 20 mg by mouth 2 (two) times daily.   ibuprofen 600 MG tablet Commonly known as: ADVIL Take 1 tablet (600 mg total) by mouth every 6 (six) hours as needed.   NIFEdipine 60 MG 24 hr tablet Commonly known as: ADALAT CC Take 1 tablet (60 mg total) by mouth daily.   oxyCODONE 5 MG immediate release tablet Commonly known as: Oxy IR/ROXICODONE Take 1 tablet (5 mg total) by mouth every 4 (four) hours as needed (pain scale 4-7).   prenatal multivitamin Tabs tablet Take 1 tablet by mouth daily at 12 noon.        Discharge home in stable condition Infant Feeding: Breast Infant Disposition:NICU Discharge instruction: per After Visit Summary and Postpartum booklet. Activity: Advance as tolerated. Pelvic rest for 6 weeks.  Diet: low salt diet Anticipated Birth Control: Unsure Postpartum Appointment:4 weeks Additional Postpartum F/U: BP check 2-3 days Future Appointments:No future appointments. Follow up Visit:  Follow-up Information    Rowland Lathe, MD. Schedule an appointment as soon as possible for a visit in 3 day(s).   Specialty: Obstetrics and Gynecology Contact information: [address] [place] [address] [phone]                   08/20/2020 Rowland Lathe, MD

## 2020-08-20 NOTE — Discharge Instructions (Signed)
Postpartum Hypertension Postpartum hypertension is high blood pressure that is higher than normal after childbirth. It usually starts within 1 to 2 days after delivery, but it can happen at any time for up to 6 weeks after delivery. For some women, medical treatment is required to prevent serious complications, such as seizures or stroke. What are the causes? The cause of this condition is not well understood. In some cases, the cause may not be known. Certain conditions may increase your risk. These include:  Hypertension that existed before pregnancy (chronic hypertension).  Hypertension that comes as a result of pregnancy (gestational hypertension).  Hypertensive disorders during pregnancy or seizures in women who have high blood pressure during pregnancy. These conditions are called preeclampsia and eclampsia.  A condition in which the liver, platelets, and red blood cells are damaged during pregnancy (HELLP syndrome).  Obesity.  Diabetes. What are the signs or symptoms? As with all types of hypertension, postpartum hypertension may not have any symptoms. Depending on how high your blood pressure is, you may experience:  Headaches. These may be mild, moderate, or severe. They may also be steady, constant, or sudden in onset (thunderclap headache).  Vision changes, such as blurry vision, flashing lights, or seeing spots.  Nausea and vomiting.  Pain in the upper right side of your abdomen.  Shortness of breath.  Difficulty breathing while lying down.  A decrease in the amount of urine that you pass. How is this diagnosed? This condition may be diagnosed based on the results of a physical exam, blood pressure measurements, and blood and urine tests. You may also have other tests, such as a CT scan or an MRI, to check for other problems of postpartum hypertension. How is this treated? If blood pressure is high enough to require treatment, your options may include:  Medicines to  reduce blood pressure (antihypertensives). Tell your health care provider if you are breastfeeding or if you plan to breastfeed. There are many antihypertensive medicines that are safe to take while breastfeeding.  Treating medical conditions that are causing hypertension.  Treating the complications of hypertension, such as seizures, stroke, or kidney problems. Your health care provider will also continue to monitor your blood pressure closely until it is within a safe range for you. Follow these instructions at home: Learn your goal blood pressure Two numbers make up your blood pressure. The first number is called systolic pressure. The second is called diastolic pressure. An example of a blood pressure reading is "120 over 80" (or 120/80). For most people, goal blood pressure is:  First number: below 140.  Second number: below 90. Your blood pressure is above normal even if only the top or bottom number is above normal. Know what to do before you take your blood pressure 30 minutes before you check your blood pressure:  Do not drink caffeine.  Do not drink alcohol.  Avoid food and drink.  Do not smoke.  Do not exercise. 5 minutes before you check your blood pressure:  Use the bathroom and urinate so that you have an empty bladder.  Sit quietly in a dining room chair. Do not sit in a soft couch or an armchair. Do not talk. Know how to take your blood pressure To check your blood pressure, follow the instructions in the manual that came with your blood pressure monitor. If you have a digital blood pressure monitor, the instructions may be as follows: 1. Sit up straight. 2. Place your feet on the floor. Do   not cross your ankles or legs. 3. Rest your left arm at the level of your heart. You may rest it on a table, desk, or chair. 4. Pull up your shirt sleeve. 5. Wrap the blood pressure cuff around the upper part of your left arm. The cuff should be 1 inch (2.5 cm) above your  elbow. It is best to wrap the cuff around bare skin. 6. Fit the cuff snugly around your arm. You should be able to place only one finger between the cuff and your arm. 7. Put the cord inside the groove of your elbow. 8. Press the power button. 9. Sit quietly while the cuff fills with air and loses air. 10. Write down the numbers on the screen. These are your blood pressure readings. 11. Wait 1-2 minutes and then repeat steps 1-10.   Record your blood pressure readings Follow your health care provider's instructions on how to record your blood pressure readings. If you were asked to use this form, follow these instructions:  Get one reading in the morning (a.m.) before you take any medicines.  Get one reading in the evening (p.m.) before supper.  Take at least 2 readings with each blood pressure check. This makes sure the results are correct. Wait 1-2 minutes between measurements.  Write down the results in the spaces on this form. Date: _______________________  a.m. _____________________(1st reading) _____________________(2nd reading)  p.m. _____________________(1st reading) _____________________(2nd reading) Date: _______________________  a.m. _____________________(1st reading) _____________________(2nd reading)  p.m. _____________________(1st reading) _____________________(2nd reading) Date: _______________________  a.m. _____________________(1st reading) _____________________(2nd reading)  p.m. _____________________(1st reading) _____________________(2nd reading) Date: _______________________  a.m. _____________________(1st reading) _____________________(2nd reading)  p.m. _____________________(1st reading) _____________________(2nd reading) Date: _______________________  a.m. _____________________(1st reading) _____________________(2nd reading)  p.m. _____________________(1st reading) _____________________(2nd reading) General instructions  Take over-the-counter and  prescription medicines only as told by your health care provider.  Do not use any products that contain nicotine or tobacco. These products include cigarettes, chewing tobacco, and vaping devices, such as e-cigarettes. If you need help quitting, ask your health care provider.  Check your blood pressure as often as recommended by your health care provider.  Return to your normal activities as told by your health care provider. Ask your health care provider what activities are safe for you.  Keep all follow-up visits. This is important. Contact a health care provider if: BLOOD PRESSURE IS 160/110 OR HIGHER  You have new symptoms, such as: ? A headache that does not get better. ? Dizziness. ? Visual changes. ? Nausea and vomiting. Get help right away if:  You develop difficulty breathing.  You have chest pain.  You faint.  You have any symptoms of a stroke. "BE FAST" is an easy way to remember the main warning signs of a stroke: ? B - Balance. Signs are dizziness, sudden trouble walking, or loss of balance. ? E - Eyes. Signs are trouble seeing or a sudden change in vision. ? F - Face. Signs are sudden weakness or numbness of the face, or the face or eyelid drooping on one side. ? A - Arms. Signs are weakness or numbness in an arm. This happens suddenly and usually on one side of the body. ? S - Speech. Signs are sudden trouble speaking, slurred speech, or trouble understanding what people say. ? T - Time. Time to call emergency services. Write down what time symptoms started.  You have other signs of a stroke, such as: ? A sudden, severe headache with no known cause. ? Nausea  or vomiting. ? Seizure. These symptoms may represent a serious problem that is an emergency. Do not wait to see if the symptoms will go away. Get medical help right away. Call your local emergency services (911 in the [place] Do not drive yourself to the hospital. Summary  Postpartum hypertension is high blood  pressure that remains higher than normal after childbirth.  For some women, medical treatment is required to prevent serious complications, such as seizures or stroke.  Follow your health care provider's instructions on how to record your blood pressure readings.  Keep all follow-up visits. This is important. This information is not intended to replace advice given to you by your health care provider. Make sure you discuss any questions you have with your health care provider. Document Revised: 01/20/2020 Document Reviewed: 01/20/2020 Elsevier Patient Education  2021 [employer] Postpartum Care After Vaginal Delivery The following information offers guidance about how to care for yourself from the time you deliver your baby to 6-12 weeks after delivery (postpartum period). If you have problems or questions, contact your health care provider for more specific instructions. Follow these instructions at home: Vaginal bleeding  It is normal to have vaginal bleeding (lochia) after delivery. Wear a sanitary pad for bleeding and discharge. ? During the first week after delivery, the amount and appearance of lochia is often similar to a menstrual period. ? Over the next few weeks, it will gradually decrease to a dry, yellow-brown discharge. ? For most women, lochia stops completely by 4-6 weeks after delivery, but can vary.  Change your sanitary pads frequently. Watch for any changes in your flow, such as: ? A sudden increase in volume. ? A change in color. ? Large blood clots.  If you pass a blood clot from your vagina, save it and call your health care provider. Do not flush blood clots down the toilet before talking with your health care provider.  Do not use tampons or douches until your health care provider approves.  If you are not breastfeeding, your period should return 6-8 weeks after delivery. If you are feeding your baby breast milk only, your period may not return until you stop  breastfeeding. Perineal care  Keep the area between the vagina and the anus (perineum) clean and dry. Use medicated pads and pain-relieving sprays and creams as directed.  If you had a surgical cut in the perineum (episiotomy) or a tear, check the area for signs of infection until you are healed. Check for: ? More redness, swelling, or pain. ? Fluid or blood coming from the cut or tear. ? Warmth. ? Pus or a bad smell.  You may be given a squirt bottle to use instead of wiping to clean the perineum area after you use the bathroom. Pat the area gently to dry it.  To relieve pain caused by an episiotomy, a tear, or swollen veins in the anus (hemorrhoids), take a warm sitz bath 2-3 times a day. In a sitz bath, the warm water should only come up to your hips and cover your buttocks.   Breast care  In the first few days after delivery, your breasts may feel heavy, full, and uncomfortable (breast engorgement). Milk may also leak from your breasts. Ask your health care provider about ways to help relieve the discomfort.  If you are breastfeeding: ? Wear a bra that supports your breasts and fits well. Use breast pads to absorb milk that leaks. ? Keep your nipples clean and dry. Apply creams  and ointments as told. ? You may have uterine contractions every time you breastfeed for up to several weeks after delivery. This helps your uterus return to its normal size. ? If you have any problems with breastfeeding, notify your health care provider or lactation consultant.  If you are not breastfeeding: ? Avoid touching your breasts. Do not squeeze out (express) milk. Doing this can make your breasts produce more milk. ? Wear a good-fitting bra and use cold packs to help with swelling. Intimacy and sexuality  Ask your health care provider when you can engage in sexual activity. This may depend upon: ? Your risk of infection. ? How fast you are healing. ? Your comfort and desire to engage in sexual  activity.  You are able to get pregnant after delivery, even if you have not had your period. Talk with your health care provider about methods of birth control (contraception) or family planning if you desire future pregnancies. Medicines  Take over-the-counter and prescription medicines only as told by your health care provider.  Take an over-the-counter stool softener to help ease bowel movements as told by your health care provider.  If you were prescribed an antibiotic medicine, take it as told by your health care provider. Do not stop taking the antibiotic even if you start to feel better.  Review all previous and current prescriptions to check for possible transfer into breast milk. Activity  Gradually return to your normal activities as told by your health care provider.  Rest as much as possible. Nap while your baby is sleeping. Eating and drinking  Drink enough fluid to keep your urine pale yellow.  To help prevent or relieve constipation, eat high-fiber foods every day.  Choose healthy eating to support breastfeeding or weight loss goals.  Take your prenatal vitamins until your health care provider tells you to stop.   General tips/recommendations  Do not use any products that contain nicotine or tobacco. These products include cigarettes, chewing tobacco, and vaping devices, such as e-cigarettes. If you need help quitting, ask your health care provider.  Do not drink alcohol, especially if you are breastfeeding.  Do not take medications or drugs that are not prescribed to you, especially if you are breastfeeding.  Visit your health care provider for a postpartum checkup within the first 3-6 weeks after delivery.  Complete a comprehensive postpartum visit no later than 12 weeks after delivery.  Keep all follow-up visits for you and your baby. Contact a health care provider if:  You feel unusually sad or worried.  Your breasts become red, painful, or hard.  You  have a fever or other signs of an infection.  You have bleeding that is soaking through one pad an hour or you have blood clots.  You have a severe headache that doesn't go away or you have vision changes.  You have nausea and vomiting and are unable to eat or drink anything for 24 hours. Get help right away if:  You have chest pain or difficulty breathing.  You have sudden, severe leg pain.  You faint or have a seizure.  You have thoughts about hurting yourself or your baby. If you ever feel like you may hurt yourself or others, or have thoughts about taking your own life, get help right away. Go to your nearest emergency department or:  Call your local emergency services (911 in the [place]  The National Suicide Prevention Lifeline at 575 160 2045. This suicide crisis helpline is open 24 hours a  day.  Text the Crisis Text Line at (438)268-2281741741 (in the [place] Summary  The period of time after you deliver your newborn up to 6-12 weeks after delivery is called the postpartum period.  Keep all follow-up visits for you and your baby.  Review all previous and current prescriptions to check for possible transfer into breast milk.  Contact a health care provider if you feel unusually sad or worried during the postpartum period. This information is not intended to replace advice given to you by your health care provider. Make sure you discuss any questions you have with your health care provider. Document Revised: 01/08/2020 Document Reviewed: 01/08/2020 Elsevier Patient Education  2021 [employer]

## 2020-08-20 NOTE — Progress Notes (Signed)
Pt discharged in stable condition.

## 2020-08-20 NOTE — Progress Notes (Signed)
Post Partum Day 3 Subjective: Madeline [name] is doing well this morning without complaints. She is ambulating, voiding, tolerating PO. Minimal lochia. No chest pain, shortness of breath, calf pain, headache, vison changes, RUQ pain  Objective: Patient Vitals for the past 24 hrs:  BP Temp Temp src Pulse Resp SpO2  08/20/20 0432 136/81 97.9 F (36.6 C) Oral 83 18 100 %  08/20/20 0045 135/86 98.1 F (36.7 C) Oral 80 18 96 %  08/19/20 2127 140/90 98.1 F (36.7 C) Oral 88 19 100 %  08/19/20 1214 129/81 98 F (36.7 C) Oral 79 18 98 %  08/19/20 0833 (!) 141/99 98 F (36.7 C) Oral 74 18 99 %    Physical Exam:  General: alert, cooperative and no distress Lochia: appropriate Uterine Fundus: firm DVT Evaluation: No evidence of DVT seen on physical exam.  Recent Labs    08/17/20 0827 08/17/20 1854 08/18/20 0506  WBC 10.1 15.6* 14.9*  HGB 12.5 12.9 11.7*  HCT 38.2 39.2 35.1*  PLT 277 274 256    Recent Labs    08/17/20 0827 08/18/20 0506  NA 135 135  K 4.7 4.6  CL 105 107  BUN 13 13  CREATININE 0.63 0.76  GLUCOSE 72 119*  BILITOT 0.4 0.3  ALT 19 16  AST 24 20  ALKPHOS 114 104  PROT 6.0* 5.8*  ALBUMIN 2.7* 2.5*    Recent Labs    08/17/20 0827 08/18/20 0506  CALCIUM 9.2 7.4*    No results for input(s): PROTIME, APTT, INR in the last 72 hours.  No results for input(s): PROTIME, APTT, INR, FIBRINOGEN in the last 72 hours. Assessment/Plan: Madeline [name] 30 y.o. [id number] PPD#3 sp SVD 1. PPC: continue routine postpartum care 2. Rubella nonimmune: s/p MMR vaccine yesterday 3. Rh pos, s/p Tdap vaccine given prenatally 4. Severe preeclampsia: s/p magnesium, blood pressures now controlled with Procardia XL 21m.  5. Dispo: discharge home with BP check in office next week, will pick up BP cuff and monitor Bps at home. Discharge instructions reviewed.   LOS: 3 days   MRowland Lathe4/15/2022, 8:00 AM

## 2020-08-20 NOTE — Progress Notes (Signed)
Discharge instructions given to patient. Pt verbalized understanding. All questions answered.

## 2020-08-21 ENCOUNTER — Ambulatory Visit: Payer: Self-pay

## 2020-08-21 NOTE — Lactation Note (Signed)
This note was copied from a baby's chart. Lactation Consultation Note LC to NICU to assist with bf'ing. Assisted mom and baby with laid back biological positioning and observed as mom independently assisted baby with brief latches. After a few attempts, baby fell asleep and no longer demonstrated feeding cues. Mom and baby remained sts as baby received gavage. Mom continues to pump frequently. Her milk volume has increased and she denies engorgement today. Mom is aware of LC services. Will plan f/u visit to assist further prn.  Patient Name: Madeline [name] [id number] Date: 08/21/2020 Reason for consult: NICU baby;Follow-up assessment Age:32 days  Feeding Mother's Current Feeding Choice: Breast Milk and Formula  LATCH Score Latch: Repeated attempts needed to sustain latch, nipple held in mouth throughout feeding, stimulation needed to elicit sucking reflex.  Audible Swallowing: None  Type of Nipple: Everted at rest and after stimulation  Comfort (Breast/Nipple): Soft / non-tender  Hold (Positioning): Assistance needed to correctly position infant at breast and maintain latch.  LATCH Score: 6   Lactation Tools Discussed/Used  27mm shield provided to use prn   Interventions Interventions: Education;Support pillows;Breast feeding basics reviewed;Assisted with latch;Position options;Skin to skin;Adjust position  Discharge Discharge Education: Engorgement and breast care  Consult Status Consult Status: Follow-up Follow-up type: In-patient   Elder Negus, MA IBCLC 08/21/2020, 12:33 PM

## 2020-08-23 ENCOUNTER — Ambulatory Visit: Payer: Self-pay

## 2020-08-23 NOTE — Lactation Note (Signed)
This note was copied from a baby's chart. Lactation Consultation Note  Patient Name: Madeline [name] [id number] Date: 08/23/2020 Reason for consult: NICU baby;Mother's request Age:32 days  LC in to room, per request to latch. Infant has already started scheduled gavage feeding. [facility] appointment has been set up for 08/23/20. Mother is looking forward to work with [facility].   Feeding Mother's Current Feeding Choice: Breast Milk and Formula Nipple Type: Nfant Slow Flow (purple)  Consult Status Consult Status: Follow-up Date: 08/24/20 Follow-up type: In-patient    Billy Rocco A Higuera Ancidey 08/23/2020, 12:11 PM

## 2020-08-24 ENCOUNTER — Ambulatory Visit: Payer: Self-pay

## 2020-08-24 NOTE — Lactation Note (Signed)
This note was copied from a baby's chart. Lactation Consultation Note  Patient Name: Madeline [name] Date: 08/24/2020 Reason for consult: Follow-up assessment;NICU baby;Late-preterm 34-36.6wks;Infant < 6lbs Age:32 days  Lactation attended a scheduled appointment to practice latching baby. I assisted with gently waking baby up to breast feed. She was quite sleepy, and I removed her clothes and placed her STS. We placed her in cross cradle hold on the left breast. I used a size 24 NS, and showed Madeline [name] how to place it in position.  Baby latched to the shield with some good suckling bursts followed by periods of rests. She is not yet able to sustain rhythmic suck/swallow/breathe patterns. I noted that her rest periods were extended followed by light/moderate suckling. Madeline [name] has ample milk volume, but baby appeared to manage her flow without incident.  Baby was at the breast for 12 minutes with active feeding of closer to 6 minutes.  SLP entered the room to also check progress. We conferred at this time.  Following the feeding, I observed Madeline [name] pump approximately 8.5 ounces (combined). She has ample pumping volume. We used a size 24 flange, and she was comfortable with this. We discussed trying a size 27 flange next time,  But as her breast tissue swells with even a larger flange, we discussed using comfort as a metric for sizing.  Madeline [name] may pump 1/2 to 1 ounce from breast prior to latching if she notices baby encountering any difficulty with managing her milk flow.  I scheduled a follow up lactation appointment for 4/20 at 1500.   Maternal Data Does the patient have breastfeeding experience prior to this delivery?: Yes  Feeding Mother's Current Feeding Choice: Breast Milk Nipple Type: Dr. Levert Feinstein Preemie  LATCH Score Latch: Repeated attempts needed to sustain latch, nipple held in mouth throughout feeding, stimulation needed to elicit  sucking reflex.  Audible Swallowing: A few with stimulation  Type of Nipple: Everted at rest and after stimulation  Comfort (Breast/Nipple): Soft / non-tender  Hold (Positioning): Assistance needed to correctly position infant at breast and maintain latch.  LATCH Score: 7   Lactation Tools Discussed/Used Tools: Flanges;Nipple Shields Nipple shield size: 24 Flange Size: 24;Other (comment) (discussed using a 27 next time) Breast pump type: Double-Electric Breast Pump Pump Education: Setup, frequency, and cleaning Reason for Pumping: NICU; supplementation Pumping frequency: q3-4 Pumped volume: 240 mL (8.5 ounces (combined))  Interventions Interventions: Breast feeding basics reviewed;Assisted with latch;Skin to skin;Adjust position;Support pillows;Education  Discharge Pump: DEBP;Personal  Consult Status Consult Status: Follow-up Date: 08/25/20 Follow-up type: In-patient    Walker Shadow 08/24/2020, 12:53 PM

## 2020-08-25 ENCOUNTER — Ambulatory Visit: Payer: Self-pay

## 2020-08-25 NOTE — Lactation Note (Signed)
This note was copied from a baby's chart. Lactation Consultation Note LC to room for observed feeding. Baby did not wake with pestering. Rescheduled for tomorrow at 1230. Notified RN. Patient Name: Madeline [name] [id number] Date: 08/25/2020   Age:32 days   Elder Negus, MA IBCLC 08/25/2020, 3:27 PM

## 2020-08-26 ENCOUNTER — Ambulatory Visit: Payer: Self-pay

## 2020-08-26 NOTE — Lactation Note (Signed)
This note was copied from a baby's chart. Lactation Consultation Note  Patient Name: Madeline [name] [id number] Date: 08/26/2020 Reason for consult: Follow-up assessment;NICU baby;Early term 37-38.6wks;Infant < 6lbs Age:32 days   LC in to assist/assess positioning and latching to the breast.  Baby showing feeding cues.  Mom pre-pumped 1 oz off the left breast.  Mom using a 24 mm nipple shield.  LC noted it appeared too large for her nipple.  Initiated a 20 mm nipple shield for a better fit.  Baby latched in cross cradle hold with assistance on supporting breast and supporting baby's head well.  Baby fed 5 mins and came off, stooled and burped.  Baby relatched for a deeper latch and fed well with rhythmic sucking and swallowing for 15 mins before pushing off the breast. Taught Mom to use breast compression during sucking to increase milk transfer. Nipple pulled well into shield and milk noted in tip of shield.   Mom holding baby STS after feeding.    LATCH Score Latch: Grasps breast easily, tongue down, lips flanged, rhythmical sucking.  Audible Swallowing: Spontaneous and intermittent  Type of Nipple: Everted at rest and after stimulation  Comfort (Breast/Nipple): Soft / non-tender  Hold (Positioning): Assistance needed to correctly position infant at breast and maintain latch.  LATCH Score: 9   Lactation Tools Discussed/Used Tools: Pump;Flanges;Nipple Shields Nipple shield size: 20 Flange Size: 24 Breast pump type: Double-Electric Breast Pump Pumping frequency: Q 3hrs Pumped volume: 60 mL  Interventions Interventions: Assisted with latch;Skin to skin;Breast massage;Hand express;Pre-pump if needed;Breast compression;Adjust position;Support pillows;DEBP;Education   Consult Status Consult Status: Follow-up Date: 08/29/20 Follow-up type: In-patient    Judee Clara 08/26/2020, 1:24 PM

## 2020-08-29 ENCOUNTER — Ambulatory Visit: Payer: Self-pay

## 2020-08-29 NOTE — Lactation Note (Signed)
This note was copied from a baby's chart. Lactation Consultation Note  Patient Name: Madeline [name] [id number] Date: 08/29/2020 Reason for consult: Follow-up assessment;NICU baby;Early term 37-38.6wks;Infant < 6lbs Age:32 days   Mom pre-pumped 1 oz from right breast.  LC in to assist/assess with positioning and latching to the breast.   Mom had baby on the breast in cross cradle hold using her boppy pillow for support.  Baby noted to be sucking with occasional swallows and notably less vigorous with this feeding.  After 3 mins, took baby off to burp. Milk noted in shield after baby came off.   Assisted Mom to latch baby without the shield.  Baby able to attain a semi-deep latch and suck/swallow for another couple minutes before falling asleep.    Encouraged Mom to continue with consistent pumping to support her milk supply.  F/U LC appt 4/25 at 0900   Feeding Nipple Type: Dr. Levert Feinstein Preemie  LATCH Score Latch: Repeated attempts needed to sustain latch, nipple held in mouth throughout feeding, stimulation needed to elicit sucking reflex.  Audible Swallowing: A few with stimulation  Type of Nipple: Everted at rest and after stimulation  Comfort (Breast/Nipple): Soft / non-tender  Hold (Positioning): Assistance needed to correctly position infant at breast and maintain latch.  LATCH Score: 7   Lactation Tools Discussed/Used Tools: Nipple Shields;Pump;Flanges Nipple shield size: 20 Flange Size: 24 Breast pump type: Double-Electric Breast Pump  Interventions Interventions: Assisted with latch;Skin to skin;Breast massage;Pre-pump if needed;Breast compression;Adjust position;Support pillows;Position options;DEBP;Education   Consult Status Consult Status: Follow-up Date: 08/30/20 Follow-up type: In-patient    Madeline [name] 08/29/2020, 12:36 PM

## 2020-08-30 ENCOUNTER — Ambulatory Visit: Payer: Self-pay

## 2020-08-30 NOTE — Lactation Note (Signed)
This note was copied from a baby's chart. Lactation Consultation Note  Patient Name: Madeline [name] [id number] Date: 08/30/2020 Reason for consult: Follow-up assessment;Early term 37-38.6wks;Infant < 6lbs;NICU baby Age:33 days  LC in to assist/assess with latching to the breast.   Mom pre-pumped 1 oz from left breast.  Baby able to latch without nipple shield for 7 minutes using some breast compression, came off with a cough and had a quick brady down to 70's.  Mom placed baby STS on her chest and baby recovered quickly and started cueing again.    LC assisted Mom with applying the nipple shield.  Mom not to compress the breast during feeding to let baby control the flow rate.  Baby stayed on with the nipple shield and fed consistently with deep jaw extensions and no signs of stress for a total of 28 mins.   Baby contented after feeding, and no supplement by gavage.  Feeding Nipple Type: Dr. Levert Feinstein Preemie  LATCH Score Latch: Grasps breast easily, tongue down, lips flanged, rhythmical sucking.  Audible Swallowing: Spontaneous and intermittent  Type of Nipple: Everted at rest and after stimulation  Comfort (Breast/Nipple): Soft / non-tender  Hold (Positioning): Assistance needed to correctly position infant at breast and maintain latch.  LATCH Score: 9   Lactation Tools Discussed/Used Tools: Pump;Nipple Shields Nipple shield size: 20 Breast pump type: Double-Electric Breast Pump  Interventions Interventions: Assisted with latch;Skin to skin;Breast massage;Hand express;Pre-pump if needed;Support pillows;DEBP   Consult Status Consult Status: Follow-up Date: 09/01/20 Follow-up type: In-patient    Judee Clara 08/30/2020, 9:05 AM

## 2020-09-05 DIAGNOSIS — Z419 Encounter for procedure for purposes other than remedying health state, unspecified: Secondary | ICD-10-CM | POA: Diagnosis not present

## 2020-09-24 DIAGNOSIS — K8020 Calculus of gallbladder without cholecystitis without obstruction: Secondary | ICD-10-CM | POA: Diagnosis not present

## 2020-10-06 DIAGNOSIS — Z419 Encounter for procedure for purposes other than remedying health state, unspecified: Secondary | ICD-10-CM | POA: Diagnosis not present

## 2020-11-05 DIAGNOSIS — Z419 Encounter for procedure for purposes other than remedying health state, unspecified: Secondary | ICD-10-CM | POA: Diagnosis not present

## 2020-12-06 DIAGNOSIS — Z419 Encounter for procedure for purposes other than remedying health state, unspecified: Secondary | ICD-10-CM | POA: Diagnosis not present

## 2020-12-31 DIAGNOSIS — Z13220 Encounter for screening for lipoid disorders: Secondary | ICD-10-CM | POA: Diagnosis not present

## 2020-12-31 DIAGNOSIS — Z1329 Encounter for screening for other suspected endocrine disorder: Secondary | ICD-10-CM | POA: Diagnosis not present

## 2020-12-31 DIAGNOSIS — I10 Essential (primary) hypertension: Secondary | ICD-10-CM | POA: Diagnosis not present

## 2021-01-06 DIAGNOSIS — Z419 Encounter for procedure for purposes other than remedying health state, unspecified: Secondary | ICD-10-CM | POA: Diagnosis not present

## 2021-02-05 DIAGNOSIS — Z419 Encounter for procedure for purposes other than remedying health state, unspecified: Secondary | ICD-10-CM | POA: Diagnosis not present

## 2021-03-01 DIAGNOSIS — J029 Acute pharyngitis, unspecified: Secondary | ICD-10-CM | POA: Diagnosis not present

## 2021-03-01 DIAGNOSIS — G4489 Other headache syndrome: Secondary | ICD-10-CM | POA: Diagnosis not present

## 2021-03-08 DIAGNOSIS — Z419 Encounter for procedure for purposes other than remedying health state, unspecified: Secondary | ICD-10-CM | POA: Diagnosis not present

## 2021-04-04 DIAGNOSIS — Z0000 Encounter for general adult medical examination without abnormal findings: Secondary | ICD-10-CM | POA: Diagnosis not present

## 2021-04-07 DIAGNOSIS — Z419 Encounter for procedure for purposes other than remedying health state, unspecified: Secondary | ICD-10-CM | POA: Diagnosis not present

## 2021-04-17 IMAGING — US US ABDOMEN LIMITED RUQ/ASCITES
1 series · 15 of 25 positions shown · non-contrast
Comparison: Ultrasound right upper quadrant June 02, 2013

CLINICAL DATA: Upper abdominal pain

EXAM:
ULTRASOUND ABDOMEN LIMITED RIGHT UPPER QUADRANT

[Series 1: us abdomen limited ruq/ascites · 51 acquisitions, 15 frames shown]
[im 1/51]
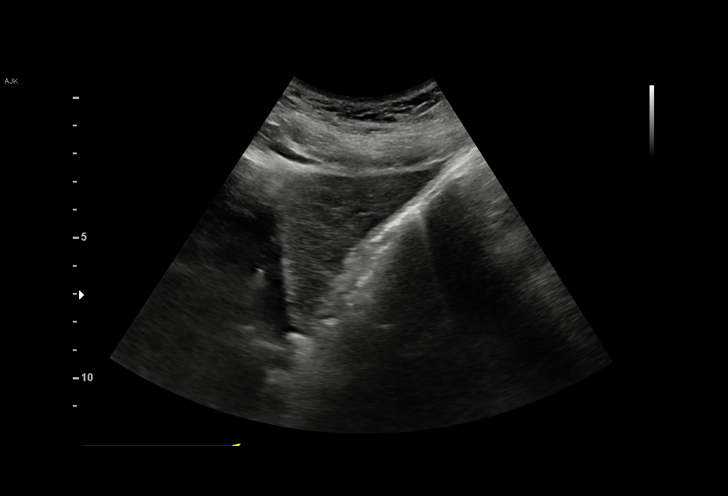
[im 5/51]
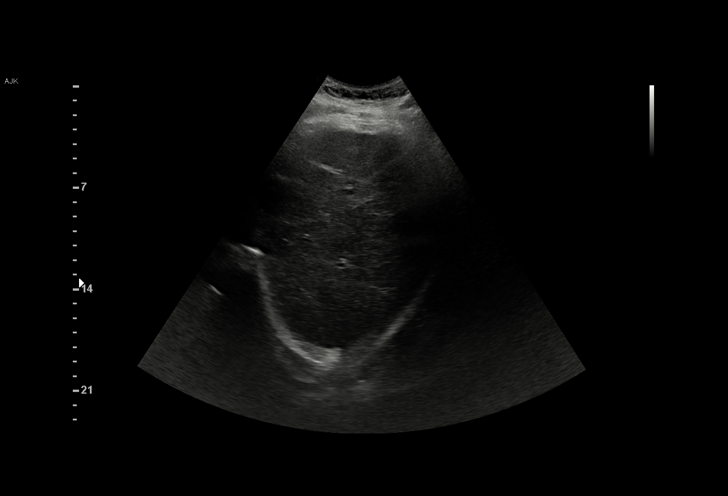
[im 9/51]
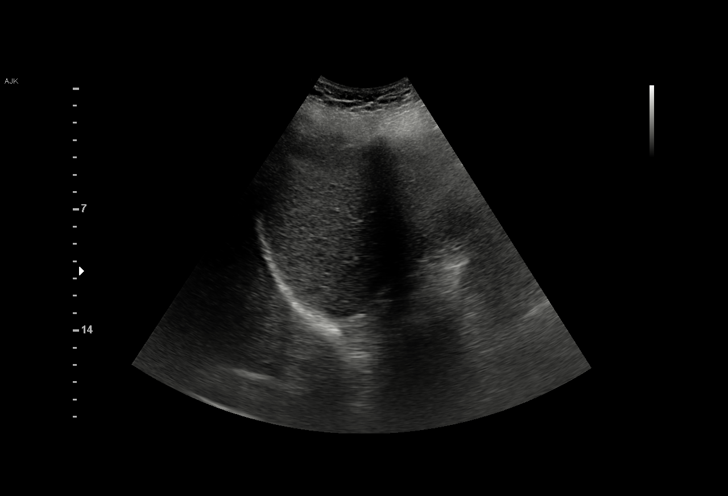
[im 11/51]
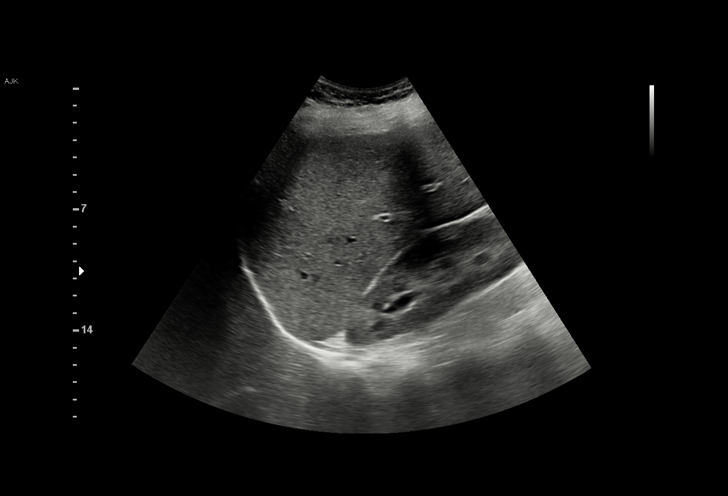
[im 15/51]
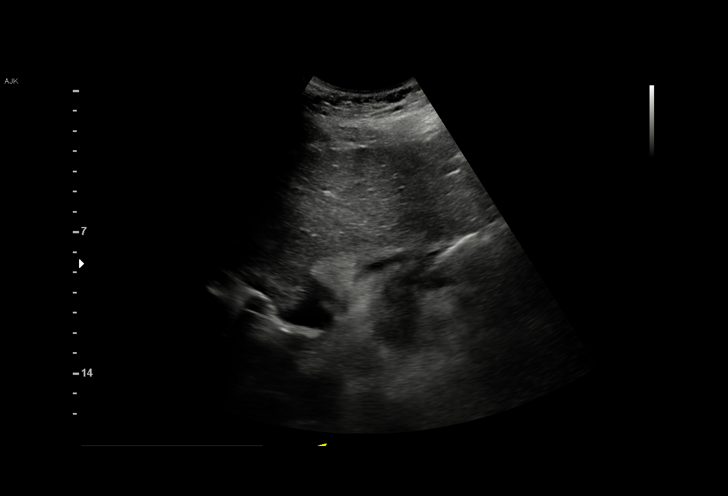
[im 19/51]
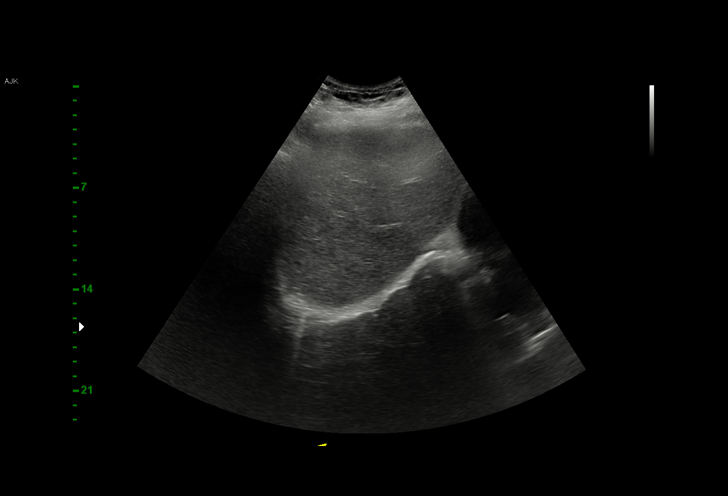
[im 21/51]
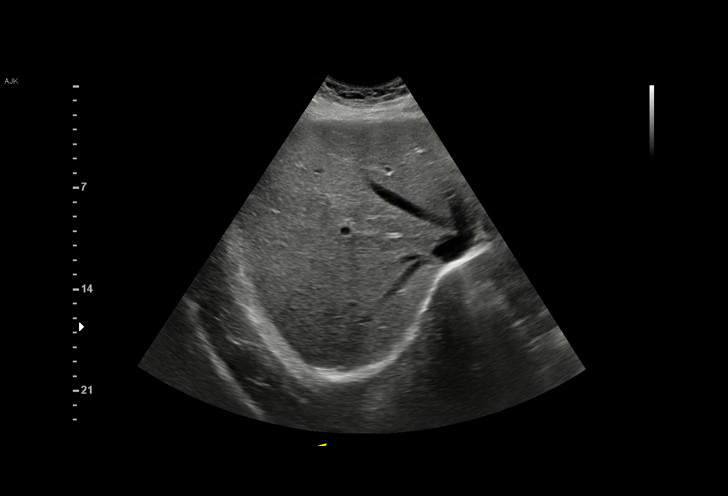
[im 26/51]
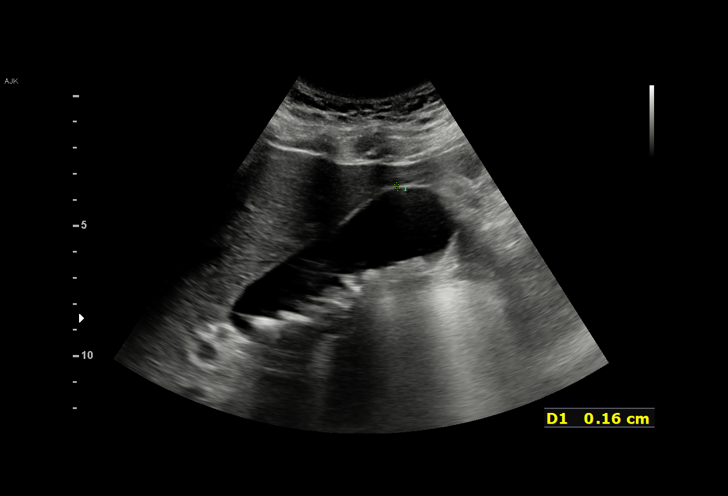
[im 30/51]
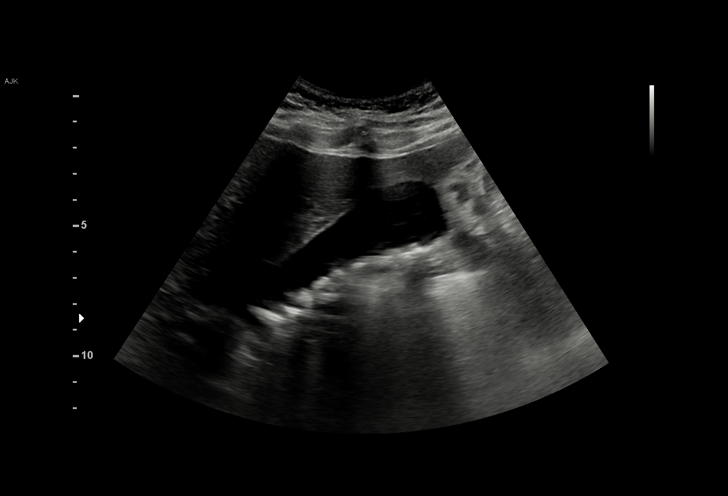
[im 32/51]
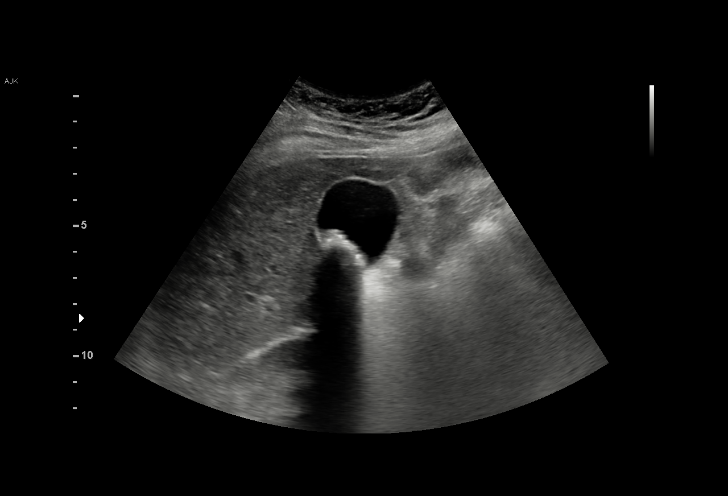
[im 36/51]
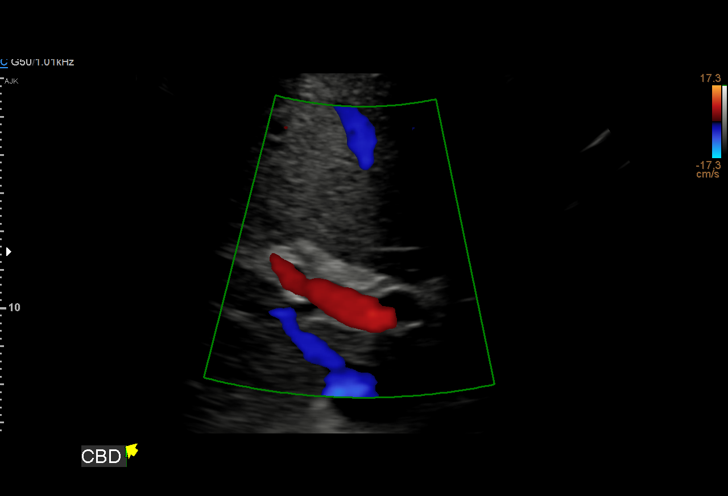
[im 40/51]
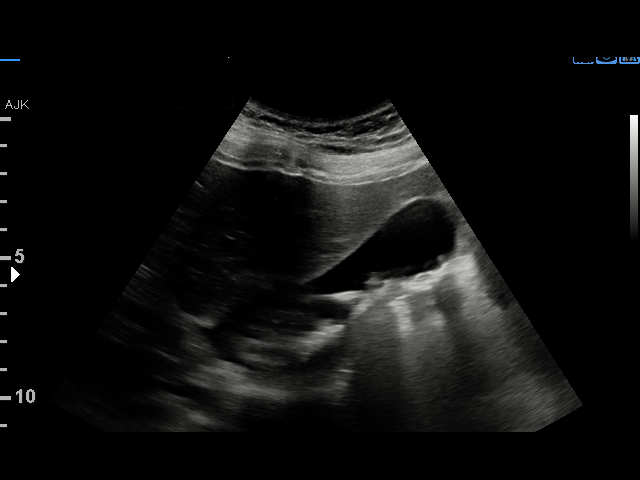
[im 42/51]
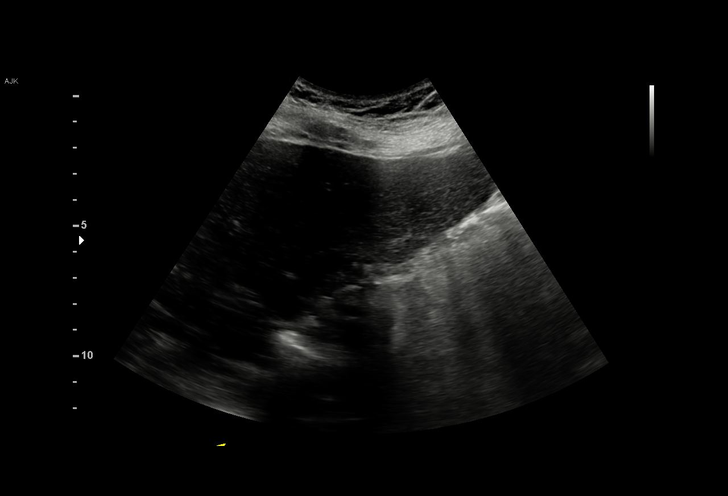
[im 46/51]
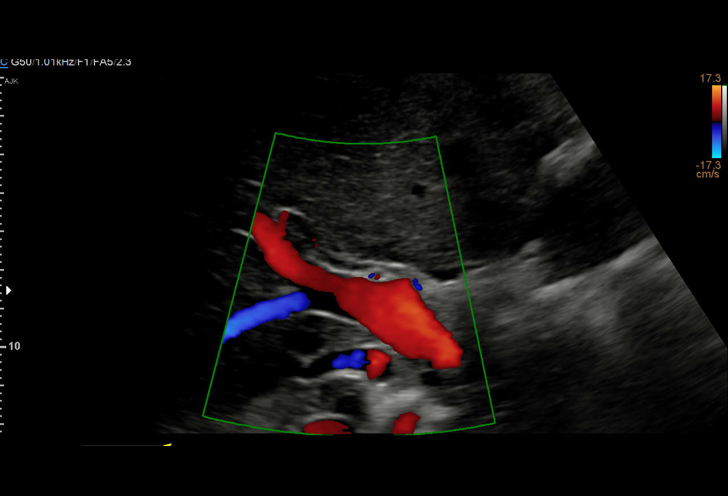
[im 51/51]
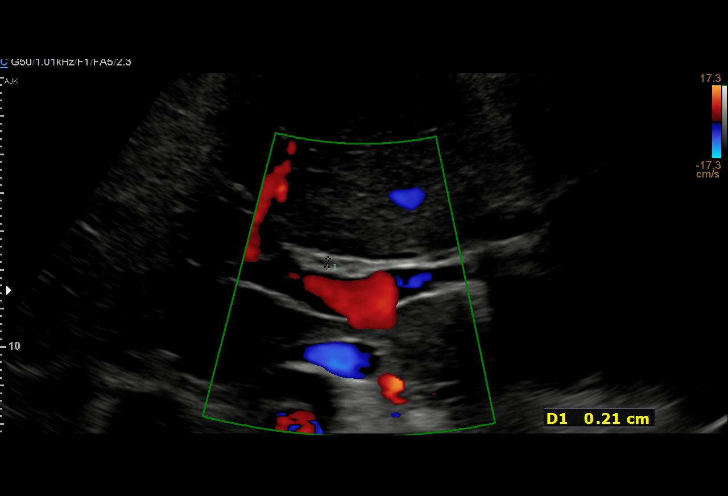

[15 of 25 positions shown; findings below may reference images not displayed]

FINDINGS: Gallbladder:

Within the gallbladder, there are multiple echogenic foci which move
and shadow as is expected with cholelithiasis. Largest individual
gallstone measures 8 mm in length. No gallbladder wall thickening or
pericholecystic fluid. No sonographic Murphy sign noted by
sonographer.

Common bile duct:

Diameter: 2 mm. No intrahepatic or extrahepatic biliary duct
dilatation.

Liver:

No focal lesion identified. Within normal limits in parenchymal
echogenicity. Portal vein is patent on color Doppler imaging with
normal direction of blood flow towards the liver.

Other: None.
IMPRESSION: Cholelithiasis. No gallbladder wall thickening or pericholecystic
fluid.

Study otherwise unremarkable.

## 2021-05-08 DIAGNOSIS — Z419 Encounter for procedure for purposes other than remedying health state, unspecified: Secondary | ICD-10-CM | POA: Diagnosis not present

## 2021-06-08 DIAGNOSIS — Z419 Encounter for procedure for purposes other than remedying health state, unspecified: Secondary | ICD-10-CM | POA: Diagnosis not present

## 2021-07-06 DIAGNOSIS — Z419 Encounter for procedure for purposes other than remedying health state, unspecified: Secondary | ICD-10-CM | POA: Diagnosis not present

## 2021-08-06 DIAGNOSIS — Z419 Encounter for procedure for purposes other than remedying health state, unspecified: Secondary | ICD-10-CM | POA: Diagnosis not present

## 2021-09-05 DIAGNOSIS — Z419 Encounter for procedure for purposes other than remedying health state, unspecified: Secondary | ICD-10-CM | POA: Diagnosis not present

## 2021-09-19 DIAGNOSIS — R110 Nausea: Secondary | ICD-10-CM | POA: Diagnosis not present

## 2021-09-19 DIAGNOSIS — J029 Acute pharyngitis, unspecified: Secondary | ICD-10-CM | POA: Diagnosis not present

## 2021-09-19 DIAGNOSIS — Z20822 Contact with and (suspected) exposure to covid-19: Secondary | ICD-10-CM | POA: Diagnosis not present

## 2021-09-21 DIAGNOSIS — T1490XA Injury, unspecified, initial encounter: Secondary | ICD-10-CM | POA: Diagnosis not present

## 2021-09-21 DIAGNOSIS — Z6841 Body Mass Index (BMI) 40.0 and over, adult: Secondary | ICD-10-CM | POA: Diagnosis not present

## 2021-09-21 DIAGNOSIS — Z713 Dietary counseling and surveillance: Secondary | ICD-10-CM | POA: Diagnosis not present

## 2021-09-30 DIAGNOSIS — J029 Acute pharyngitis, unspecified: Secondary | ICD-10-CM | POA: Diagnosis not present

## 2021-09-30 DIAGNOSIS — J020 Streptococcal pharyngitis: Secondary | ICD-10-CM | POA: Diagnosis not present

## 2021-09-30 DIAGNOSIS — R112 Nausea with vomiting, unspecified: Secondary | ICD-10-CM | POA: Diagnosis not present

## 2021-10-04 DIAGNOSIS — F4389 Other reactions to severe stress: Secondary | ICD-10-CM | POA: Diagnosis not present

## 2021-10-06 DIAGNOSIS — Z419 Encounter for procedure for purposes other than remedying health state, unspecified: Secondary | ICD-10-CM | POA: Diagnosis not present

## 2021-10-10 DIAGNOSIS — Z6841 Body Mass Index (BMI) 40.0 and over, adult: Secondary | ICD-10-CM | POA: Diagnosis not present

## 2021-10-10 DIAGNOSIS — Z713 Dietary counseling and surveillance: Secondary | ICD-10-CM | POA: Diagnosis not present

## 2021-10-18 DIAGNOSIS — F4389 Other reactions to severe stress: Secondary | ICD-10-CM | POA: Diagnosis not present

## 2021-10-25 DIAGNOSIS — F4389 Other reactions to severe stress: Secondary | ICD-10-CM | POA: Diagnosis not present

## 2021-11-01 DIAGNOSIS — F4389 Other reactions to severe stress: Secondary | ICD-10-CM | POA: Diagnosis not present

## 2021-11-05 DIAGNOSIS — Z419 Encounter for procedure for purposes other than remedying health state, unspecified: Secondary | ICD-10-CM | POA: Diagnosis not present

## 2021-11-09 DIAGNOSIS — Z713 Dietary counseling and surveillance: Secondary | ICD-10-CM | POA: Diagnosis not present

## 2021-11-09 DIAGNOSIS — Z6837 Body mass index (BMI) 37.0-37.9, adult: Secondary | ICD-10-CM | POA: Diagnosis not present

## 2021-11-09 DIAGNOSIS — F4389 Other reactions to severe stress: Secondary | ICD-10-CM | POA: Diagnosis not present

## 2021-11-15 DIAGNOSIS — F4389 Other reactions to severe stress: Secondary | ICD-10-CM | POA: Diagnosis not present

## 2021-11-29 DIAGNOSIS — F4389 Other reactions to severe stress: Secondary | ICD-10-CM | POA: Diagnosis not present

## 2021-12-06 DIAGNOSIS — Z419 Encounter for procedure for purposes other than remedying health state, unspecified: Secondary | ICD-10-CM | POA: Diagnosis not present

## 2021-12-06 DIAGNOSIS — F4389 Other reactions to severe stress: Secondary | ICD-10-CM | POA: Diagnosis not present

## 2021-12-13 DIAGNOSIS — F4389 Other reactions to severe stress: Secondary | ICD-10-CM | POA: Diagnosis not present

## 2021-12-14 DIAGNOSIS — Z6836 Body mass index (BMI) 36.0-36.9, adult: Secondary | ICD-10-CM | POA: Diagnosis not present

## 2021-12-14 DIAGNOSIS — Z713 Dietary counseling and surveillance: Secondary | ICD-10-CM | POA: Diagnosis not present

## 2021-12-20 DIAGNOSIS — F4389 Other reactions to severe stress: Secondary | ICD-10-CM | POA: Diagnosis not present

## 2021-12-28 DIAGNOSIS — F4389 Other reactions to severe stress: Secondary | ICD-10-CM | POA: Diagnosis not present

## 2022-01-04 DIAGNOSIS — F4389 Other reactions to severe stress: Secondary | ICD-10-CM | POA: Diagnosis not present

## 2022-01-06 DIAGNOSIS — Z419 Encounter for procedure for purposes other than remedying health state, unspecified: Secondary | ICD-10-CM | POA: Diagnosis not present

## 2022-01-11 DIAGNOSIS — F4389 Other reactions to severe stress: Secondary | ICD-10-CM | POA: Diagnosis not present

## 2022-01-13 DIAGNOSIS — Z713 Dietary counseling and surveillance: Secondary | ICD-10-CM | POA: Diagnosis not present

## 2022-01-13 DIAGNOSIS — Z6836 Body mass index (BMI) 36.0-36.9, adult: Secondary | ICD-10-CM | POA: Diagnosis not present

## 2022-01-18 DIAGNOSIS — F4389 Other reactions to severe stress: Secondary | ICD-10-CM | POA: Diagnosis not present

## 2022-01-25 DIAGNOSIS — F4389 Other reactions to severe stress: Secondary | ICD-10-CM | POA: Diagnosis not present

## 2022-02-01 DIAGNOSIS — F4389 Other reactions to severe stress: Secondary | ICD-10-CM | POA: Diagnosis not present

## 2022-02-05 DIAGNOSIS — Z419 Encounter for procedure for purposes other than remedying health state, unspecified: Secondary | ICD-10-CM | POA: Diagnosis not present

## 2022-02-08 DIAGNOSIS — F4389 Other reactions to severe stress: Secondary | ICD-10-CM | POA: Diagnosis not present

## 2022-02-15 DIAGNOSIS — Z713 Dietary counseling and surveillance: Secondary | ICD-10-CM | POA: Diagnosis not present

## 2022-02-15 DIAGNOSIS — Z6835 Body mass index (BMI) 35.0-35.9, adult: Secondary | ICD-10-CM | POA: Diagnosis not present

## 2022-02-15 DIAGNOSIS — F4389 Other reactions to severe stress: Secondary | ICD-10-CM | POA: Diagnosis not present

## 2022-02-21 DIAGNOSIS — F4389 Other reactions to severe stress: Secondary | ICD-10-CM | POA: Diagnosis not present

## 2022-03-01 DIAGNOSIS — F4389 Other reactions to severe stress: Secondary | ICD-10-CM | POA: Diagnosis not present

## 2022-03-08 DIAGNOSIS — F4389 Other reactions to severe stress: Secondary | ICD-10-CM | POA: Diagnosis not present

## 2022-03-08 DIAGNOSIS — Z419 Encounter for procedure for purposes other than remedying health state, unspecified: Secondary | ICD-10-CM | POA: Diagnosis not present

## 2022-03-15 DIAGNOSIS — F4389 Other reactions to severe stress: Secondary | ICD-10-CM | POA: Diagnosis not present

## 2022-03-20 DIAGNOSIS — Z6835 Body mass index (BMI) 35.0-35.9, adult: Secondary | ICD-10-CM | POA: Diagnosis not present

## 2022-03-20 DIAGNOSIS — Z713 Dietary counseling and surveillance: Secondary | ICD-10-CM | POA: Diagnosis not present

## 2022-03-22 DIAGNOSIS — F4389 Other reactions to severe stress: Secondary | ICD-10-CM | POA: Diagnosis not present

## 2022-03-28 DIAGNOSIS — F4389 Other reactions to severe stress: Secondary | ICD-10-CM | POA: Diagnosis not present

## 2022-04-07 DIAGNOSIS — Z419 Encounter for procedure for purposes other than remedying health state, unspecified: Secondary | ICD-10-CM | POA: Diagnosis not present

## 2022-04-12 DIAGNOSIS — F4389 Other reactions to severe stress: Secondary | ICD-10-CM | POA: Diagnosis not present

## 2022-04-20 DIAGNOSIS — F4389 Other reactions to severe stress: Secondary | ICD-10-CM | POA: Diagnosis not present

## 2022-04-26 DIAGNOSIS — F4389 Other reactions to severe stress: Secondary | ICD-10-CM | POA: Diagnosis not present

## 2022-05-03 DIAGNOSIS — F4389 Other reactions to severe stress: Secondary | ICD-10-CM | POA: Diagnosis not present

## 2022-05-08 DIAGNOSIS — Z419 Encounter for procedure for purposes other than remedying health state, unspecified: Secondary | ICD-10-CM | POA: Diagnosis not present

## 2022-05-15 DIAGNOSIS — F4389 Other reactions to severe stress: Secondary | ICD-10-CM | POA: Diagnosis not present

## 2022-05-22 DIAGNOSIS — F4389 Other reactions to severe stress: Secondary | ICD-10-CM | POA: Diagnosis not present

## 2022-05-29 DIAGNOSIS — F4389 Other reactions to severe stress: Secondary | ICD-10-CM | POA: Diagnosis not present

## 2022-06-07 DIAGNOSIS — F4389 Other reactions to severe stress: Secondary | ICD-10-CM | POA: Diagnosis not present

## 2022-06-08 DIAGNOSIS — Z419 Encounter for procedure for purposes other than remedying health state, unspecified: Secondary | ICD-10-CM | POA: Diagnosis not present

## 2022-06-16 DIAGNOSIS — F4389 Other reactions to severe stress: Secondary | ICD-10-CM | POA: Diagnosis not present

## 2022-06-20 DIAGNOSIS — Z6837 Body mass index (BMI) 37.0-37.9, adult: Secondary | ICD-10-CM | POA: Diagnosis not present

## 2022-06-20 DIAGNOSIS — Z713 Dietary counseling and surveillance: Secondary | ICD-10-CM | POA: Diagnosis not present

## 2022-06-22 DIAGNOSIS — F4389 Other reactions to severe stress: Secondary | ICD-10-CM | POA: Diagnosis not present

## 2022-06-26 DIAGNOSIS — F4389 Other reactions to severe stress: Secondary | ICD-10-CM | POA: Diagnosis not present

## 2022-07-03 DIAGNOSIS — F4389 Other reactions to severe stress: Secondary | ICD-10-CM | POA: Diagnosis not present

## 2022-07-07 DIAGNOSIS — Z419 Encounter for procedure for purposes other than remedying health state, unspecified: Secondary | ICD-10-CM | POA: Diagnosis not present

## 2022-07-10 DIAGNOSIS — F4389 Other reactions to severe stress: Secondary | ICD-10-CM | POA: Diagnosis not present

## 2022-07-17 DIAGNOSIS — F4389 Other reactions to severe stress: Secondary | ICD-10-CM | POA: Diagnosis not present

## 2022-07-24 DIAGNOSIS — F4389 Other reactions to severe stress: Secondary | ICD-10-CM | POA: Diagnosis not present

## 2022-07-31 DIAGNOSIS — F4389 Other reactions to severe stress: Secondary | ICD-10-CM | POA: Diagnosis not present

## 2022-08-01 DIAGNOSIS — J40 Bronchitis, not specified as acute or chronic: Secondary | ICD-10-CM | POA: Diagnosis not present

## 2022-08-07 DIAGNOSIS — Z419 Encounter for procedure for purposes other than remedying health state, unspecified: Secondary | ICD-10-CM | POA: Diagnosis not present

## 2022-08-08 DIAGNOSIS — F4389 Other reactions to severe stress: Secondary | ICD-10-CM | POA: Diagnosis not present

## 2022-08-15 DIAGNOSIS — F4389 Other reactions to severe stress: Secondary | ICD-10-CM | POA: Diagnosis not present

## 2022-08-24 DIAGNOSIS — F4389 Other reactions to severe stress: Secondary | ICD-10-CM | POA: Diagnosis not present

## 2022-08-31 DIAGNOSIS — F4389 Other reactions to severe stress: Secondary | ICD-10-CM | POA: Diagnosis not present

## 2022-09-05 DIAGNOSIS — F4389 Other reactions to severe stress: Secondary | ICD-10-CM | POA: Diagnosis not present

## 2022-09-06 DIAGNOSIS — Z419 Encounter for procedure for purposes other than remedying health state, unspecified: Secondary | ICD-10-CM | POA: Diagnosis not present

## 2022-09-13 DIAGNOSIS — F4389 Other reactions to severe stress: Secondary | ICD-10-CM | POA: Diagnosis not present

## 2022-09-20 DIAGNOSIS — F4389 Other reactions to severe stress: Secondary | ICD-10-CM | POA: Diagnosis not present

## 2022-10-11 DIAGNOSIS — F4389 Other reactions to severe stress: Secondary | ICD-10-CM | POA: Diagnosis not present

## 2022-10-16 DIAGNOSIS — F4389 Other reactions to severe stress: Secondary | ICD-10-CM | POA: Diagnosis not present

## 2022-11-23 ENCOUNTER — Ambulatory Visit
Admission: EM | Admit: 2022-11-23 | Discharge: 2022-11-23 | Disposition: A | Discharge status: Home / Self Care | Payer: Medicaid Other | Attending: Internal Medicine | Admitting: Internal Medicine

## 2022-11-23 DIAGNOSIS — J029 Acute pharyngitis, unspecified: Secondary | ICD-10-CM | POA: Insufficient documentation

## 2022-11-23 DIAGNOSIS — B349 Viral infection, unspecified: Secondary | ICD-10-CM | POA: Insufficient documentation

## 2022-11-23 LAB — POCT RAPID STREP A (OFFICE): Rapid Strep A Screen: NEGATIVE

## 2022-11-23 MED ORDER — LIDOCAINE VISCOUS HCL 2 % MT SOLN
15.0000 mL | Freq: Four times a day (QID) | OROMUCOSAL | 0 refills | Status: AC | PRN
Start: 2022-11-23 — End: ?

## 2022-11-23 NOTE — ED Provider Notes (Signed)
[facility]    CSN: [id number] Arrival date & time: 11/23/22  1440      History   Chief Complaint Chief Complaint  Patient presents with   Sore Throat    HPI Madeline [name] is a 32 y.o. female  presents for evaluation of URI symptoms for 1 days. Patient reports associated symptoms of sore throat mild cough. Denies N/V/D, fevers, congestion, ear pain, body aches, shortness of breath. Patient does not have a hx of asthma or smoking. No known sick contacts.  Pt has taken nothing OTC for symptoms. Pt has no other concerns at this time.    Sore Throat    Past Medical History:  Diagnosis Date   Gall stones    Headache(784.0)    not requiring meds   Infection    UTI   MRSA infection    reports hx- ? 4 yrs ago   Pyelonephritis complicating pregnancy in first trimester, antepartum     Patient Active Problem List   Diagnosis Date Noted   Preeclampsia, severe, third trimester 08/17/2020   Pregnancy 09/23/2015   Pyelonephritis affecting pregnancy in second trimester, antepartum 05/01/2015   NSVD (normal spontaneous vaginal delivery) 03/16/2013    Past Surgical History:  Procedure Laterality Date   NO PAST SURGERIES      OB History     Gravida  3   Para  3   Term  2   Preterm  1   AB  0   Living  3      SAB  0   IAB  0   Ectopic  0   Multiple  0   Live Births  3            Home Medications    Prior to Admission medications   Medication Sig Start Date End Date Taking? Authorizing Provider  lidocaine (XYLOCAINE) 2 % solution Use as directed 15 mLs in the mouth or throat every 6 (six) hours as needed (Sore throat). Gargle and spit do not swallow 11/23/22  Yes Radford Pax, NP  acetaminophen (TYLENOL) 325 MG tablet Take 2 tablets (650 mg total) by mouth every 4 (four) hours as needed (for pain scale < 4). 08/20/20   Charlett Nose, MD  Blood Pressure KIT 1 kit by Does not apply route 2 (two) times daily. 08/20/20   Charlett Nose, MD  calcium carbonate (TUMS - DOSED IN MG ELEMENTAL CALCIUM) 500 MG chewable tablet Chew 2 tablets by mouth 3 (three) times daily as needed for indigestion or heartburn.    [provider]  famotidine (PEPCID) 20 MG tablet Take 20 mg by mouth 2 (two) times daily.    [provider]  ibuprofen (ADVIL) 600 MG tablet Take 1 tablet (600 mg total) by mouth every 6 (six) hours as needed. 08/20/20   Charlett Nose, MD  NIFEdipine (ADALAT CC) 60 MG 24 hr tablet Take 1 tablet (60 mg total) by mouth daily. 08/20/20   Charlett Nose, MD  oxyCODONE (OXY IR/ROXICODONE) 5 MG immediate release tablet Take 1 tablet (5 mg total) by mouth every 4 (four) hours as needed (pain scale 4-7). 08/20/20   Charlett Nose, MD  Prenatal Vit-Fe Fumarate-FA (PRENATAL MULTIVITAMIN) TABS tablet Take 1 tablet by mouth daily at 12 noon.    [provider]    Family History Family History  Problem Relation Age of Onset   Asthma Brother    Restless legs syndrome Mother  Restless legs syndrome Sister    Heart disease Father    Stroke Paternal Grandmother     Social History Social History   Tobacco Use   Smoking status: Never   Smokeless tobacco: Never  Vaping Use   Vaping status: Never Used  Substance Use Topics   Alcohol use: No   Drug use: No     Allergies   Patient has no known allergies.   Review of Systems Review of Systems  HENT:  Positive for sore throat.   Respiratory:  Positive for cough.      Physical Exam Triage Vital Signs ED Triage Vitals  Encounter Vitals Group     BP 11/23/22 1457 (!) 134/90     Systolic BP Percentile --      Diastolic BP Percentile --      Pulse Rate 11/23/22 1457 (!) 109     Resp 11/23/22 1457 18     Temp 11/23/22 1457 99.3 F (37.4 C)     Temp Source 11/23/22 1457 Oral     SpO2 11/23/22 1457 98 %     Weight --      Height --      Head Circumference --      Peak Flow --      Pain Score 11/23/22 1456 2      Pain Loc --      Pain Education --      Exclude from Growth Chart --    No data found.  Updated Vital Signs BP (!) 134/90 (BP Location: Left Arm)   Pulse (!) 109   Temp 99.3 F (37.4 C) (Oral)   Resp 18   LMP 11/11/2022 (Exact Date)   SpO2 98%   Breastfeeding No   Visual Acuity Right Eye Distance:   Left Eye Distance:   Bilateral Distance:    Right Eye Near:   Left Eye Near:    Bilateral Near:     Physical Exam Vitals and nursing note reviewed.  Constitutional:      General: She is not in acute distress.    Appearance: She is well-developed. She is not ill-appearing.  HENT:     Head: Normocephalic and atraumatic.     Right Ear: Tympanic membrane and ear canal normal.     Left Ear: Tympanic membrane and ear canal normal.     Nose: No congestion.     Mouth/Throat:     Mouth: Mucous membranes are moist.     Pharynx: Oropharynx is clear. Uvula midline. Posterior oropharyngeal erythema present.     Tonsils: No tonsillar exudate or tonsillar abscesses.  Eyes:     Conjunctiva/sclera: Conjunctivae normal.     Pupils: Pupils are equal, round, and reactive to light.  Cardiovascular:     Rate and Rhythm: Normal rate and regular rhythm.     Heart sounds: Normal heart sounds.  Pulmonary:     Effort: Pulmonary effort is normal.     Breath sounds: Normal breath sounds.  Musculoskeletal:     Cervical back: Normal range of motion and neck supple.  Lymphadenopathy:     Cervical: No cervical adenopathy.  Skin:    General: Skin is warm and dry.  Neurological:     General: No focal deficit present.     Mental Status: She is alert and oriented to person, place, and time.  Psychiatric:        Mood and Affect: Mood normal.        Behavior: Behavior normal.  UC Treatments / Results  Labs (all labs ordered are listed, but only abnormal results are displayed) Labs Reviewed  CULTURE, GROUP A STREP [facility])  POCT RAPID STREP A (OFFICE)    EKG   Radiology No results  found.  Procedures Procedures (including critical care time)  Medications Ordered in UC Medications - No data to display  Initial Impression / Assessment and Plan / UC Course  I have reviewed the triage vital signs and the nursing notes.  Pertinent labs & imaging results that were available during my care of the patient were reviewed by me and considered in my medical decision making (see chart for details).     Negative rapid strep, will culture.  Patient declined COVID testing.  Discussed viral illness and symptomatic treatment.  Lidocaine gargle as needed.  PCP follow-up if symptoms do not improve.  ER precautions reviewed and patient verbalized understanding. Final Clinical Impressions(s) / UC Diagnoses   Final diagnoses:  Sore throat  Viral illness     Discharge Instructions      Your rapid strep test was negative.  The clinic will send this for culture and contact you if that is positive.  He may use lidocaine gargle as needed.  Gargle and spit do not swallow.  you may also do warm liquids and salt water gargles.  Over-the-counter ibuprofen or Tylenol as needed.  Follow-up with your PCP if your symptoms do not improve.  Please go to the ER for any worsening symptoms.  I hope you feel better soon!    ED Prescriptions     Medication Sig Dispense Auth. Provider   lidocaine (XYLOCAINE) 2 % solution Use as directed 15 mLs in the mouth or throat every 6 (six) hours as needed (Sore throat). Gargle and spit do not swallow 100 mL Radford Pax, NP      PDMP not reviewed this encounter.   Radford Pax, NP 11/23/22 [phone]

## 2022-11-23 NOTE — Discharge Instructions (Signed)
Your rapid strep test was negative.  The clinic will send this for culture and contact you if that is positive.  He may use lidocaine gargle as needed.  Gargle and spit do not swallow.  you may also do warm liquids and salt water gargles.  Over-the-counter ibuprofen or Tylenol as needed.  Follow-up with your PCP if your symptoms do not improve.  Please go to the ER for any worsening symptoms.  I hope you feel better soon!

## 2022-11-23 NOTE — ED Triage Notes (Signed)
Pt presents with c/o sore throat since this morning, has not taken anything at home.    Pt reports she wants strep testing. Denies fever and other sxs.

## 2022-11-24 LAB — CULTURE, GROUP A STREP (THRC)

## 2022-11-25 LAB — CULTURE, GROUP A STREP (THRC)

## 2022-11-26 ENCOUNTER — Telehealth: Payer: Self-pay

## 2022-11-26 MED ORDER — AMOXICILLIN 500 MG PO CAPS: 500.0000 mg | ORAL_CAPSULE | Freq: Two times a day (BID) | ORAL | 0 refills | Status: DC

## 2022-11-26 NOTE — Telephone Encounter (Signed)
Pt called clinic-states +strep culture-Dr Chaney Malling notified-orders for rx amoxil 500mg  x 10 days #20-done

## 2022-12-18 ENCOUNTER — Ambulatory Visit
Admission: EM | Admit: 2022-12-18 | Discharge: 2022-12-18 | Disposition: A | Discharge status: Home / Self Care | Payer: Medicaid Other | Attending: Physician Assistant | Admitting: Physician Assistant

## 2022-12-18 DIAGNOSIS — J020 Streptococcal pharyngitis: Secondary | ICD-10-CM | POA: Diagnosis not present

## 2022-12-18 LAB — POCT RAPID STREP A (OFFICE): Rapid Strep A Screen: POSITIVE — AB

## 2022-12-18 MED ORDER — AMOXICILLIN 500 MG PO CAPS: 500.0000 mg | ORAL_CAPSULE | Freq: Three times a day (TID) | ORAL | 0 refills | Status: DC

## 2022-12-18 NOTE — ED Provider Notes (Signed)
[name]    CSN: [id number] Arrival date & time: 12/18/22  1509      History   Chief Complaint Chief Complaint  Patient presents with   Sore Throat    HPI Madeline [name] is a 32 y.o. female.   Patient here today for evaluation of sore throat that started yesterday morning.  She states that symptoms started after she returned from vacation in [place].  She states she has not any runny nose but has had a very mild cough due to trouble swallowing due to pain.  She has not had any rashes.  She denies any fever.  She has taken Robitussin without significant improvement.  The history is provided by the patient.  Sore Throat Pertinent negatives include no shortness of breath.    Past Medical History:  Diagnosis Date   Gall stones    Headache(784.0)    not requiring meds   Infection    UTI   MRSA infection    reports hx- ? 4 yrs ago   Pyelonephritis complicating pregnancy in first trimester, antepartum     Patient Active Problem List   Diagnosis Date Noted   Preeclampsia, severe, third trimester 08/17/2020   Pregnancy 09/23/2015   Pyelonephritis affecting pregnancy in second trimester, antepartum 05/01/2015   NSVD (normal spontaneous vaginal delivery) 03/16/2013    Past Surgical History:  Procedure Laterality Date   NO PAST SURGERIES      OB History     Gravida  3   Para  3   Term  2   Preterm  1   AB  0   Living  3      SAB  0   IAB  0   Ectopic  0   Multiple  0   Live Births  3            Home Medications    Prior to Admission medications   Medication Sig Start Date End Date Taking? Authorizing Provider  amoxicillin (AMOXIL) 500 MG capsule Take 1 capsule (500 mg total) by mouth 3 (three) times daily. 12/18/22  Yes Tomi Bamberger, PA-C  acetaminophen (TYLENOL) 325 MG tablet Take 2 tablets (650 mg total) by mouth every 4 (four) hours as needed (for pain scale < 4). 08/20/20   Charlett Nose, MD   Blood Pressure KIT 1 kit by Does not apply route 2 (two) times daily. 08/20/20   Charlett Nose, MD  calcium carbonate (TUMS - DOSED IN MG ELEMENTAL CALCIUM) 500 MG chewable tablet Chew 2 tablets by mouth 3 (three) times daily as needed for indigestion or heartburn.    [provider]  famotidine (PEPCID) 20 MG tablet Take 20 mg by mouth 2 (two) times daily.    [provider]  ibuprofen (ADVIL) 600 MG tablet Take 1 tablet (600 mg total) by mouth every 6 (six) hours as needed. 08/20/20   Charlett Nose, MD  lidocaine (XYLOCAINE) 2 % solution Use as directed 15 mLs in the mouth or throat every 6 (six) hours as needed (Sore throat). Gargle and spit do not swallow 11/23/22   Radford Pax, NP  NIFEdipine (ADALAT CC) 60 MG 24 hr tablet Take 1 tablet (60 mg total) by mouth daily. 08/20/20   Charlett Nose, MD  oxyCODONE (OXY IR/ROXICODONE) 5 MG immediate release tablet Take 1 tablet (5 mg total) by mouth every 4 (four) hours as needed (pain scale 4-7). 08/20/20   Derl Barrow  E, MD  Prenatal Vit-Fe Fumarate-FA (PRENATAL MULTIVITAMIN) TABS tablet Take 1 tablet by mouth daily at 12 noon.    [provider]    Family History Family History  Problem Relation Age of Onset   Asthma Brother    Restless legs syndrome Mother    Restless legs syndrome Sister    Heart disease Father    Stroke Paternal Grandmother     Social History Social History   Tobacco Use   Smoking status: Never   Smokeless tobacco: Never  Vaping Use   Vaping status: Never Used  Substance Use Topics   Alcohol use: No   Drug use: No     Allergies   Patient has no known allergies.   Review of Systems Review of Systems  Constitutional:  Negative for chills and fever.  HENT:  Positive for sore throat. Negative for congestion, ear pain and rhinorrhea.   Eyes:  Negative for discharge and redness.  Respiratory:  Positive for cough. Negative for shortness of breath.    Gastrointestinal:  Negative for diarrhea, nausea and vomiting.     Physical Exam Triage Vital Signs ED Triage Vitals  Encounter Vitals Group     BP 12/18/22 1517 137/86     Systolic BP Percentile --      Diastolic BP Percentile --      Pulse Rate 12/18/22 1517 98     Resp 12/18/22 1517 18     Temp 12/18/22 1517 98.1 F (36.7 C)     Temp Source 12/18/22 1517 Oral     SpO2 12/18/22 1517 97 %     Weight 12/18/22 1516 221 lb (100.2 kg)     Height 12/18/22 1516 5\' 2"  (1.575 m)     Head Circumference --      Peak Flow --      Pain Score 12/18/22 1515 5     Pain Loc --      Pain Education --      Exclude from Growth Chart --    No data found.  Updated Vital Signs BP 137/86 (BP Location: Left Arm)   Pulse 90   Temp 98.1 F (36.7 C) (Oral)   Resp 18   Ht 5\' 2"  (1.575 m)   Wt 221 lb (100.2 kg)   LMP 11/11/2022 (Exact Date)   SpO2 97%   BMI 40.42 kg/m      Physical Exam Vitals and nursing note reviewed.  Constitutional:      General: She is not in acute distress.    Appearance: Normal appearance. She is not ill-appearing.  HENT:     Head: Normocephalic and atraumatic.     Nose: No congestion or rhinorrhea.     Mouth/Throat:     Mouth: Mucous membranes are moist.     Pharynx: Oropharynx is clear. Posterior oropharyngeal erythema present. No oropharyngeal exudate.  Eyes:     Conjunctiva/sclera: Conjunctivae normal.  Cardiovascular:     Rate and Rhythm: Normal rate and regular rhythm.  Pulmonary:     Effort: Pulmonary effort is normal. No respiratory distress.     Breath sounds: Normal breath sounds. No wheezing, rhonchi or rales.  Neurological:     Mental Status: She is alert.  Psychiatric:        Mood and Affect: Mood normal.        Behavior: Behavior normal.        Thought Content: Thought content normal.      UC Treatments / Results  Labs (  all labs ordered are listed, but only abnormal results are displayed) Labs Reviewed  POCT RAPID STREP A (OFFICE)  - Abnormal; Notable for the following components:      Result Value   Rapid Strep A Screen Positive (*)    All other components within normal limits    EKG   Radiology No results found.  Procedures Procedures (including critical care time)  Medications Ordered in UC Medications - No data to display  Initial Impression / Assessment and Plan / UC Course  I have reviewed the triage vital signs and the nursing notes.  Pertinent labs & imaging results that were available during my care of the patient were reviewed by me and considered in my medical decision making (see chart for details).    Strep screening positive.  Will treat with amoxicillin and recommended follow-up if no gradual improvement with any further concerns.  Final Clinical Impressions(s) / UC Diagnoses   Final diagnoses:  Streptococcal sore throat   Discharge Instructions   None    ED Prescriptions     Medication Sig Dispense Auth. Provider   amoxicillin (AMOXIL) 500 MG capsule Take 1 capsule (500 mg total) by mouth 3 (three) times daily. 21 capsule Tomi Bamberger, PA-C      PDMP not reviewed this encounter.   Tomi Bamberger, PA-C 12/18/22 1557

## 2022-12-18 NOTE — ED Triage Notes (Signed)
Started "yesterday morning". [place]". No runny nose. Some cough "because it hurts to swallow and clear my throat". No new/unexplained rash.

## 2023-01-02 ENCOUNTER — Ambulatory Visit
Admission: EM | Admit: 2023-01-02 | Discharge: 2023-01-02 | Disposition: A | Discharge status: Home / Self Care | Payer: Medicaid Other | Attending: Physician Assistant | Admitting: Physician Assistant

## 2023-01-02 DIAGNOSIS — J029 Acute pharyngitis, unspecified: Secondary | ICD-10-CM | POA: Insufficient documentation

## 2023-01-02 LAB — POCT RAPID STREP A (OFFICE): Rapid Strep A Screen: NEGATIVE

## 2023-01-02 NOTE — ED Provider Notes (Signed)
[name]    CSN: [id number] Arrival date & time: 01/02/23  1202      History   Chief Complaint Chief Complaint  Patient presents with   Sore Throat    Strep Exposure    HPI Madeline [name] is a 32 y.o. female.   Patient here today for evaluation of sore throat. She reports that her family has been battling strep in their homoe over the last month. She has not had fever. She denies cough. She declines covid screening.   The history is provided by the patient.  Sore Throat Pertinent negatives include no abdominal pain and no shortness of breath.    Past Medical History:  Diagnosis Date   Gall stones    Headache(784.0)    not requiring meds   Infection    UTI   MRSA infection    reports hx- ? 4 yrs ago   Pyelonephritis complicating pregnancy in first trimester, antepartum     Patient Active Problem List   Diagnosis Date Noted   Preeclampsia, severe, third trimester 08/17/2020   Pregnancy 09/23/2015   Pyelonephritis affecting pregnancy in second trimester, antepartum 05/01/2015   NSVD (normal spontaneous vaginal delivery) 03/16/2013    Past Surgical History:  Procedure Laterality Date   NO PAST SURGERIES      OB History     Gravida  3   Para  3   Term  2   Preterm  1   AB  0   Living  3      SAB  0   IAB  0   Ectopic  0   Multiple  0   Live Births  3            Home Medications    Prior to Admission medications   Medication Sig Start Date End Date Taking? Authorizing Provider  acetaminophen (TYLENOL) 325 MG tablet Take 2 tablets (650 mg total) by mouth every 4 (four) hours as needed (for pain scale < 4). 08/20/20   Charlett Nose, MD  amoxicillin (AMOXIL) 500 MG capsule Take 1 capsule (500 mg total) by mouth 3 (three) times daily. 12/18/22   Tomi Bamberger, PA-C  Blood Pressure KIT 1 kit by Does not apply route 2 (two) times daily. 08/20/20   Charlett Nose, MD  calcium carbonate (TUMS - DOSED IN MG  ELEMENTAL CALCIUM) 500 MG chewable tablet Chew 2 tablets by mouth 3 (three) times daily as needed for indigestion or heartburn.    [provider]  famotidine (PEPCID) 20 MG tablet Take 20 mg by mouth 2 (two) times daily.    [provider]  ibuprofen (ADVIL) 600 MG tablet Take 1 tablet (600 mg total) by mouth every 6 (six) hours as needed. 08/20/20   Charlett Nose, MD  lidocaine (XYLOCAINE) 2 % solution Use as directed 15 mLs in the mouth or throat every 6 (six) hours as needed (Sore throat). Gargle and spit do not swallow 11/23/22   Radford Pax, NP  NIFEdipine (ADALAT CC) 60 MG 24 hr tablet Take 1 tablet (60 mg total) by mouth daily. 08/20/20   Charlett Nose, MD  oxyCODONE (OXY IR/ROXICODONE) 5 MG immediate release tablet Take 1 tablet (5 mg total) by mouth every 4 (four) hours as needed (pain scale 4-7). 08/20/20   Charlett Nose, MD  Prenatal Vit-Fe Fumarate-FA (PRENATAL MULTIVITAMIN) TABS tablet Take 1 tablet by mouth daily at 12 noon.    [provider]    Family History Family History  Problem Relation Age of Onset   Restless legs syndrome Mother    Heart disease Father    Restless legs syndrome Sister    Asthma Brother    Stroke Paternal Grandmother     Social History Social History   Tobacco Use   Smoking status: Never   Smokeless tobacco: Never  Vaping Use   Vaping status: Never Used  Substance Use Topics   Alcohol use: No   Drug use: No     Allergies   Patient has no known allergies.   Review of Systems Review of Systems  Constitutional:  Negative for chills and fever.  HENT:  Positive for sore throat. Negative for congestion and ear pain.   Eyes:  Negative for discharge and redness.  Respiratory:  Negative for cough, shortness of breath and wheezing.   Gastrointestinal:  Negative for abdominal pain, diarrhea, nausea and vomiting.     Physical Exam Triage Vital Signs ED Triage Vitals  Encounter Vitals Group      BP 01/02/23 1212 112/68     Systolic BP Percentile --      Diastolic BP Percentile --      Pulse Rate 01/02/23 1212 86     Resp 01/02/23 1212 18     Temp 01/02/23 1212 98 F (36.7 C)     Temp Source 01/02/23 1212 Oral     SpO2 01/02/23 1212 98 %     Weight 01/02/23 1211 221 lb (100.2 kg)     Height 01/02/23 1211 5\' 2"  (1.575 m)     Head Circumference --      Peak Flow --      Pain Score 01/02/23 1207 2     Pain Loc --      Pain Education --      Exclude from Growth Chart --    No data found.  Updated Vital Signs BP 112/68 (BP Location: Left Arm)   Pulse 86   Temp 98 F (36.7 C) (Oral)   Resp 18   Ht 5\' 2"  (1.575 m)   Wt 221 lb (100.2 kg)   LMP 12/29/2022 (Exact Date)   SpO2 98%   BMI 40.42 kg/m   Physical Exam Vitals and nursing note reviewed.  Constitutional:      General: She is not in acute distress.    Appearance: Normal appearance. She is not ill-appearing.  HENT:     Head: Normocephalic and atraumatic.     Nose: No congestion.     Mouth/Throat:     Mouth: Mucous membranes are moist.     Pharynx: Posterior oropharyngeal erythema present. No oropharyngeal exudate.  Eyes:     Conjunctiva/sclera: Conjunctivae normal.  Cardiovascular:     Rate and Rhythm: Normal rate and regular rhythm.     Heart sounds: Normal heart sounds. No murmur heard. Pulmonary:     Effort: Pulmonary effort is normal. No respiratory distress.     Breath sounds: Normal breath sounds. No wheezing, rhonchi or rales.  Skin:    General: Skin is warm and dry.  Neurological:     Mental Status: She is alert.  Psychiatric:        Mood and Affect: Mood normal.        Thought Content: Thought content normal.      UC Treatments / Results  Labs (all labs ordered are listed, but only abnormal results are displayed) Labs Reviewed  CULTURE, GROUP A STREP [facility])  POCT RAPID STREP A (OFFICE)    EKG   Radiology No results found.  Procedures Procedures (including critical care  time)  Medications Ordered in UC Medications - No data to display  Initial Impression / Assessment and Plan / UC Course  I have reviewed the triage vital signs and the nursing notes.  Pertinent labs & imaging results that were available during my care of the patient were reviewed by me and considered in my medical decision making (see chart for details).    Strep screening negative.  Throat culture ordered.  Encouraged follow up if no gradual improvement or with any further concerns.   Final Clinical Impressions(s) / UC Diagnoses   Final diagnoses:  Acute pharyngitis, unspecified etiology   Discharge Instructions   None    ED Prescriptions   None    PDMP not reviewed this encounter.   Tomi Bamberger, PA-C 01/02/23 [phone]

## 2023-01-02 NOTE — ED Triage Notes (Signed)
"  We have been dealing with strep on/off for about a month or so". Still "sore throat" and "just two weeks ago got over it". No fever.

## 2023-01-04 LAB — CULTURE, GROUP A STREP (THRC)

## 2023-01-07 ENCOUNTER — Ambulatory Visit
Admission: EM | Admit: 2023-01-07 | Discharge: 2023-01-07 | Disposition: A | Discharge status: Home / Self Care | Payer: Medicaid Other | Attending: Physician Assistant | Admitting: Physician Assistant

## 2023-01-07 DIAGNOSIS — J029 Acute pharyngitis, unspecified: Secondary | ICD-10-CM

## 2023-01-07 MED ORDER — DOXYCYCLINE HYCLATE 100 MG PO CAPS: 100.0000 mg | ORAL_CAPSULE | Freq: Two times a day (BID) | ORAL | 0 refills | Status: DC

## 2023-01-07 NOTE — ED Triage Notes (Signed)
Follow up to Sore Throat: "My throat still hurts and I don't even want to swallow". This morning "as I am brushing my teeth, coughed up green stuff". No runny nose, No congestion. NO fever. Rapid Strep and Culture (Negative).

## 2023-01-07 NOTE — ED Provider Notes (Signed)
[name]    CSN: [id number] Arrival date & time: 01/07/23  0820      History   Chief Complaint Chief Complaint  Patient presents with   Sore Throat    Follow up    HPI Madeline [name] is a 32 y.o. female.   Patient here today for evaluation of continued throat pain.  She states that this morning when she was brushing her teeth she gagged and coughed up green stuff.  She has not had any runny nose or congestion.  She denies significant cough.  She has not had fever.  She recently was seen for similar and had rapid strep and culture that were negative.  The history is provided by the patient.  Sore Throat Pertinent negatives include no abdominal pain and no shortness of breath.    Past Medical History:  Diagnosis Date   Gall stones    Headache(784.0)    not requiring meds   Infection    UTI   MRSA infection    reports hx- ? 4 yrs ago   Pyelonephritis complicating pregnancy in first trimester, antepartum     Patient Active Problem List   Diagnosis Date Noted   Preeclampsia, severe, third trimester 08/17/2020   Pregnancy 09/23/2015   Pyelonephritis affecting pregnancy in second trimester, antepartum 05/01/2015   NSVD (normal spontaneous vaginal delivery) 03/16/2013    Past Surgical History:  Procedure Laterality Date   NO PAST SURGERIES      OB History     Gravida  3   Para  3   Term  2   Preterm  1   AB  0   Living  3      SAB  0   IAB  0   Ectopic  0   Multiple  0   Live Births  3            Home Medications    Prior to Admission medications   Medication Sig Start Date End Date Taking? Authorizing Provider  acetaminophen (TYLENOL) 325 MG tablet Take 2 tablets (650 mg total) by mouth every 4 (four) hours as needed (for pain scale < 4). 08/20/20  Yes Charlett Nose, MD  doxycycline (VIBRAMYCIN) 100 MG capsule Take 1 capsule (100 mg total) by mouth 2 (two) times daily. 01/07/23  Yes Tomi Bamberger, PA-C   ibuprofen (ADVIL) 600 MG tablet Take 1 tablet (600 mg total) by mouth every 6 (six) hours as needed. 08/20/20  Yes Charlett Nose, MD  Blood Pressure KIT 1 kit by Does not apply route 2 (two) times daily. 08/20/20   Charlett Nose, MD  calcium carbonate (TUMS - DOSED IN MG ELEMENTAL CALCIUM) 500 MG chewable tablet Chew 2 tablets by mouth 3 (three) times daily as needed for indigestion or heartburn.    [provider]  famotidine (PEPCID) 20 MG tablet Take 20 mg by mouth 2 (two) times daily.    [provider]  lidocaine (XYLOCAINE) 2 % solution Use as directed 15 mLs in the mouth or throat every 6 (six) hours as needed (Sore throat). Gargle and spit do not swallow 11/23/22   Radford Pax, NP  NIFEdipine (ADALAT CC) 60 MG 24 hr tablet Take 1 tablet (60 mg total) by mouth daily. 08/20/20   Charlett Nose, MD  oxyCODONE (OXY IR/ROXICODONE) 5 MG immediate release tablet Take 1 tablet (5 mg total) by mouth every 4 (four) hours as needed (pain scale 4-7). 08/20/20  Charlett Nose, MD  Prenatal Vit-Fe Fumarate-FA (PRENATAL MULTIVITAMIN) TABS tablet Take 1 tablet by mouth daily at 12 noon.    [provider]    Family History Family History  Problem Relation Age of Onset   Restless legs syndrome Mother    Heart disease Father    Restless legs syndrome Sister    Asthma Brother    Stroke Paternal Grandmother     Social History Social History   Tobacco Use   Smoking status: Never   Smokeless tobacco: Never  Vaping Use   Vaping status: Never Used  Substance Use Topics   Alcohol use: No   Drug use: No     Allergies   Patient has no known allergies.   Review of Systems Review of Systems  Constitutional:  Negative for chills and fever.  HENT:  Positive for sore throat. Negative for congestion, ear pain and sinus pressure.   Eyes:  Negative for discharge and redness.  Respiratory:  Negative for cough, shortness of breath and wheezing.    Gastrointestinal:  Negative for abdominal pain, diarrhea, nausea and vomiting.     Physical Exam Triage Vital Signs ED Triage Vitals  Encounter Vitals Group     BP 01/07/23 0856 107/78     Systolic BP Percentile --      Diastolic BP Percentile --      Pulse Rate 01/07/23 0856 85     Resp 01/07/23 0856 18     Temp 01/07/23 0856 98.5 F (36.9 C)     Temp Source 01/07/23 0856 Oral     SpO2 01/07/23 0856 97 %     Weight 01/07/23 0855 221 lb (100.2 kg)     Height 01/07/23 0855 5\' 2"  (1.575 m)     Head Circumference --      Peak Flow --      Pain Score 01/07/23 0853 10     Pain Loc --      Pain Education --      Exclude from Growth Chart --    No data found.  Updated Vital Signs BP 107/78 (BP Location: Left Arm)   Pulse 85   Temp 98.5 F (36.9 C) (Oral)   Resp 18   Ht 5\' 2"  (1.575 m)   Wt 221 lb (100.2 kg)   LMP 12/29/2022 (Exact Date)   SpO2 97%   BMI 40.42 kg/m      Physical Exam Vitals and nursing note reviewed.  Constitutional:      General: She is not in acute distress.    Appearance: Normal appearance. She is not ill-appearing.  HENT:     Head: Normocephalic and atraumatic.     Nose: No congestion or rhinorrhea.     Mouth/Throat:     Mouth: Mucous membranes are moist.     Pharynx: Posterior oropharyngeal erythema present. No oropharyngeal exudate.  Eyes:     Conjunctiva/sclera: Conjunctivae normal.  Cardiovascular:     Rate and Rhythm: Normal rate and regular rhythm.     Heart sounds: Normal heart sounds. No murmur heard. Pulmonary:     Effort: Pulmonary effort is normal. No respiratory distress.     Breath sounds: Normal breath sounds. No wheezing, rhonchi or rales.  Skin:    General: Skin is warm and dry.  Neurological:     Mental Status: She is alert.  Psychiatric:        Mood and Affect: Mood normal.        Thought Content:  Thought content normal.      UC Treatments / Results  Labs (all labs ordered are listed, but only abnormal results  are displayed) Labs Reviewed - No data to display  EKG   Radiology No results found.  Procedures Procedures (including critical care time)  Medications Ordered in UC Medications - No data to display  Initial Impression / Assessment and Plan / UC Course  I have reviewed the triage vital signs and the nursing notes.  Pertinent labs & imaging results that were available during my care of the patient were reviewed by me and considered in my medical decision making (see chart for details).    Given duration of symptoms will treat with doxycycline.  Advise follow-up if no gradual improvement or with any further concerns.  Final Clinical Impressions(s) / UC Diagnoses   Final diagnoses:  Acute pharyngitis, unspecified etiology   Discharge Instructions   None    ED Prescriptions     Medication Sig Dispense Auth. Provider   doxycycline (VIBRAMYCIN) 100 MG capsule Take 1 capsule (100 mg total) by mouth 2 (two) times daily. 20 capsule Tomi Bamberger, PA-C      PDMP not reviewed this encounter.   Tomi Bamberger, PA-C 01/07/23 1144

## 2023-02-12 ENCOUNTER — Ambulatory Visit
Admission: EM | Admit: 2023-02-12 | Discharge: 2023-02-12 | Disposition: A | Discharge status: Home / Self Care | Payer: Medicaid Other | Attending: Physician Assistant | Admitting: Physician Assistant

## 2023-02-12 DIAGNOSIS — J020 Streptococcal pharyngitis: Secondary | ICD-10-CM

## 2023-02-12 LAB — POCT RAPID STREP A (OFFICE): Rapid Strep A Screen: POSITIVE — AB

## 2023-02-12 MED ORDER — AMOXICILLIN 500 MG PO CAPS: 500.0000 mg | ORAL_CAPSULE | Freq: Two times a day (BID) | ORAL | 0 refills | Status: AC

## 2023-02-12 NOTE — ED Provider Notes (Signed)
[name]    CSN: [id number] Arrival date & time: 02/12/23  1619      History   Chief Complaint Chief Complaint  Patient presents with   Sore Throat    Family of 2    HPI Madeline [name] is a 32 y.o. female.   Patient here today for evaluation of sore throat that started yesterday.  She has not had any other symptoms.  She states that her younger daughter has had similar symptoms and thinks that she might have given it to her.  She denies any fever.  She has not had any rash.  She does not report treatment.  The history is provided by the patient.  Sore Throat Pertinent negatives include no abdominal pain and no shortness of breath.    Past Medical History:  Diagnosis Date   Gall stones    Headache(784.0)    not requiring meds   Infection    UTI   MRSA infection    reports hx- ? 4 yrs ago   Pyelonephritis complicating pregnancy in first trimester, antepartum     Patient Active Problem List   Diagnosis Date Noted   Preeclampsia, severe, third trimester 08/17/2020   Pregnancy 09/23/2015   Pyelonephritis affecting pregnancy in second trimester 05/01/2015   NSVD (normal spontaneous vaginal delivery) 03/16/2013    Past Surgical History:  Procedure Laterality Date   NO PAST SURGERIES      OB History     Gravida  3   Para  3   Term  2   Preterm  1   AB  0   Living  3      SAB  0   IAB  0   Ectopic  0   Multiple  0   Live Births  3            Home Medications    Prior to Admission medications   Medication Sig Start Date End Date Taking? Authorizing Provider  amoxicillin (AMOXIL) 500 MG capsule Take 1 capsule (500 mg total) by mouth 2 (two) times daily for 10 days. 02/12/23 02/22/23 Yes Tomi Bamberger, PA-C  acetaminophen (TYLENOL) 325 MG tablet Take 2 tablets (650 mg total) by mouth every 4 (four) hours as needed (for pain scale < 4). 08/20/20   Charlett Nose, MD  Blood Pressure KIT 1 kit by Does not apply route  2 (two) times daily. 08/20/20   Charlett Nose, MD  calcium carbonate (TUMS - DOSED IN MG ELEMENTAL CALCIUM) 500 MG chewable tablet Chew 2 tablets by mouth 3 (three) times daily as needed for indigestion or heartburn.    [provider]  famotidine (PEPCID) 20 MG tablet Take 20 mg by mouth 2 (two) times daily.    [provider]  ibuprofen (ADVIL) 600 MG tablet Take 1 tablet (600 mg total) by mouth every 6 (six) hours as needed. 08/20/20   Charlett Nose, MD  lidocaine (XYLOCAINE) 2 % solution Use as directed 15 mLs in the mouth or throat every 6 (six) hours as needed (Sore throat). Gargle and spit do not swallow 11/23/22   Radford Pax, NP  NIFEdipine (ADALAT CC) 60 MG 24 hr tablet Take 1 tablet (60 mg total) by mouth daily. 08/20/20   Charlett Nose, MD  oxyCODONE (OXY IR/ROXICODONE) 5 MG immediate release tablet Take 1 tablet (5 mg total) by mouth every 4 (four) hours as needed (pain scale 4-7). 08/20/20   Derl Barrow  E, MD  Prenatal Vit-Fe Fumarate-FA (PRENATAL MULTIVITAMIN) TABS tablet Take 1 tablet by mouth daily at 12 noon.    [provider]    Family History Family History  Problem Relation Age of Onset   Restless legs syndrome Mother    Heart disease Father    Restless legs syndrome Sister    Asthma Brother    Stroke Paternal Grandmother     Social History Social History   Tobacco Use   Smoking status: Never   Smokeless tobacco: Never  Vaping Use   Vaping status: Never Used  Substance Use Topics   Alcohol use: No   Drug use: No     Allergies   Patient has no known allergies.   Review of Systems Review of Systems  Constitutional:  Negative for chills and fever.  HENT:  Positive for sore throat. Negative for congestion.   Eyes:  Negative for discharge and redness.  Respiratory:  Negative for cough and shortness of breath.   Gastrointestinal:  Negative for abdominal pain, diarrhea, nausea and vomiting.     Physical  Exam Triage Vital Signs ED Triage Vitals  Encounter Vitals Group     BP 02/12/23 1637 113/77     Systolic BP Percentile --      Diastolic BP Percentile --      Pulse Rate 02/12/23 1637 90     Resp 02/12/23 1637 18     Temp 02/12/23 1637 98.6 F (37 C)     Temp Source 02/12/23 1637 Oral     SpO2 02/12/23 1637 96 %     Weight 02/12/23 1636 230 lb (104.3 kg)     Height 02/12/23 1636 5\' 2"  (1.575 m)     Head Circumference --      Peak Flow --      Pain Score 02/12/23 1629 5     Pain Loc --      Pain Education --      Exclude from Growth Chart --    No data found.  Updated Vital Signs BP 113/77 (BP Location: Left Arm)   Pulse 90   Temp 98.6 F (37 C) (Oral)   Resp 18   Ht 5\' 2"  (1.575 m)   Wt 230 lb (104.3 kg)   LMP 12/26/2022 (Exact Date)   SpO2 96%   BMI 42.07 kg/m       Physical Exam Vitals and nursing note reviewed.  Constitutional:      General: She is not in acute distress.    Appearance: Normal appearance. She is not ill-appearing.  HENT:     Head: Normocephalic and atraumatic.     Nose: No congestion or rhinorrhea.     Mouth/Throat:     Mouth: Mucous membranes are moist.     Pharynx: Posterior oropharyngeal erythema present. No oropharyngeal exudate.  Eyes:     Conjunctiva/sclera: Conjunctivae normal.  Cardiovascular:     Rate and Rhythm: Normal rate and regular rhythm.  Pulmonary:     Effort: Pulmonary effort is normal. No respiratory distress.     Breath sounds: No wheezing, rhonchi or rales.  Neurological:     Mental Status: She is alert.  Psychiatric:        Mood and Affect: Mood normal.        Behavior: Behavior normal.        Thought Content: Thought content normal.      UC Treatments / Results  Labs (all labs ordered are listed, but only abnormal  results are displayed) Labs Reviewed  POCT RAPID STREP A (OFFICE) - Abnormal; Notable for the following components:      Result Value   Rapid Strep A Screen Positive (*)    All other  components within normal limits    EKG   Radiology No results found.  Procedures Procedures (including critical care time)  Medications Ordered in UC Medications - No data to display  Initial Impression / Assessment and Plan / UC Course  I have reviewed the triage vital signs and the nursing notes.  Pertinent labs & imaging results that were available during my care of the patient were reviewed by me and considered in my medical decision making (see chart for details).    Amoxicillin prescribed to cover strep throat.  Recommended follow-up if no gradual improvement or with any further concerns.  Final Clinical Impressions(s) / UC Diagnoses   Final diagnoses:  Strep pharyngitis   Discharge Instructions   None    ED Prescriptions     Medication Sig Dispense Auth. Provider   amoxicillin (AMOXIL) 500 MG capsule Take 1 capsule (500 mg total) by mouth 2 (two) times daily for 10 days. 20 capsule Tomi Bamberger, PA-C      PDMP not reviewed this encounter.   Tomi Bamberger, PA-C 02/12/23 [phone]

## 2023-02-12 NOTE — ED Triage Notes (Signed)
"  I woke up yesterday morning with sore throat, hurts to swallow". No fever (known). No new/unexplained rash.

## 2023-04-12 ENCOUNTER — Ambulatory Visit
Admission: RE | Admit: 2023-04-12 | Discharge: 2023-04-12 | Disposition: A | Discharge status: Home / Self Care | Payer: Medicaid Other | Source: Ambulatory Visit | Attending: Emergency Medicine | Admitting: Emergency Medicine

## 2023-04-12 VITALS — BP 115/81 | HR 76 | Temp 98.3°F | Resp 16 | Ht 62.0 in | Wt 230.0 lb

## 2023-04-12 DIAGNOSIS — N3000 Acute cystitis without hematuria: Secondary | ICD-10-CM | POA: Insufficient documentation

## 2023-04-12 LAB — POCT URINALYSIS DIP (MANUAL ENTRY)
Bilirubin, UA: NEGATIVE
Glucose, UA: NEGATIVE mg/dL
Ketones, POC UA: NEGATIVE mg/dL
Nitrite, UA: NEGATIVE
Protein Ur, POC: NEGATIVE mg/dL
Spec Grav, UA: 1.025 (ref 1.010–1.025)
Urobilinogen, UA: 0.2 U/dL
pH, UA: 7 (ref 5.0–8.0)

## 2023-04-12 MED ORDER — NITROFURANTOIN MONOHYD MACRO 100 MG PO CAPS: 100.0000 mg | ORAL_CAPSULE | Freq: Two times a day (BID) | ORAL | 0 refills | Status: AC

## 2023-04-12 NOTE — ED Triage Notes (Signed)
Patient presents with pain with urination, vaginal odor and cloudy urine x 2 days. No treatment used.

## 2023-04-12 NOTE — ED Provider Notes (Signed)
[name]    CSN: [id number] Arrival date & time: 04/12/23  1655      History   Chief Complaint Chief Complaint  Patient presents with   Urinary Frequency    UTI sxs and discharge - Entered by patient    HPI Madeline [name] is a 32 y.o. female.   Patient presents with concerns of possible UTI. She reports burning with urination, frequency, and the urine is cloudy and strong smelling for the past couple days. She denies fever, nausea/vomiting, discharge, hematuria, or abdominal or back pain. The patient reports similar symptoms with past UTI, last was about a year ago. She has not taken anything for her symptoms.  The history is provided by the patient.  Urinary Frequency Pertinent negatives include no abdominal pain and no headaches.    Past Medical History:  Diagnosis Date   Gall stones    Headache(784.0)    not requiring meds   Infection    UTI   MRSA infection    reports hx- ? 4 yrs ago   Pyelonephritis complicating pregnancy in first trimester, antepartum     Patient Active Problem List   Diagnosis Date Noted   Preeclampsia, severe, third trimester 08/17/2020   Pregnancy 09/23/2015   Pyelonephritis affecting pregnancy in second trimester 05/01/2015   NSVD (normal spontaneous vaginal delivery) 03/16/2013    Past Surgical History:  Procedure Laterality Date   NO PAST SURGERIES      OB History     Gravida  3   Para  3   Term  2   Preterm  1   AB  0   Living  3      SAB  0   IAB  0   Ectopic  0   Multiple  0   Live Births  3            Home Medications    Prior to Admission medications   Medication Sig Start Date End Date Taking? Authorizing Provider  nitrofurantoin, macrocrystal-monohydrate, (MACROBID) 100 MG capsule Take 1 capsule (100 mg total) by mouth 2 (two) times daily. 04/12/23  Yes Chaske Paskett L, PA  acetaminophen (TYLENOL) 325 MG tablet Take 2 tablets (650 mg total) by mouth every 4 (four) hours as  needed (for pain scale < 4). 08/20/20   Charlett Nose, MD  Blood Pressure KIT 1 kit by Does not apply route 2 (two) times daily. 08/20/20   Charlett Nose, MD  calcium carbonate (TUMS - DOSED IN MG ELEMENTAL CALCIUM) 500 MG chewable tablet Chew 2 tablets by mouth 3 (three) times daily as needed for indigestion or heartburn.    [provider]  famotidine (PEPCID) 20 MG tablet Take 20 mg by mouth 2 (two) times daily.    [provider]  ibuprofen (ADVIL) 600 MG tablet Take 1 tablet (600 mg total) by mouth every 6 (six) hours as needed. 08/20/20   Charlett Nose, MD  lidocaine (XYLOCAINE) 2 % solution Use as directed 15 mLs in the mouth or throat every 6 (six) hours as needed (Sore throat). Gargle and spit do not swallow 11/23/22   Radford Pax, NP  NIFEdipine (ADALAT CC) 60 MG 24 hr tablet Take 1 tablet (60 mg total) by mouth daily. 08/20/20   Charlett Nose, MD  oxyCODONE (OXY IR/ROXICODONE) 5 MG immediate release tablet Take 1 tablet (5 mg total) by mouth every 4 (four) hours as needed (pain scale 4-7). 08/20/20  Charlett Nose, MD  Prenatal Vit-Fe Fumarate-FA (PRENATAL MULTIVITAMIN) TABS tablet Take 1 tablet by mouth daily at 12 noon.    [provider]    Family History Family History  Problem Relation Age of Onset   Restless legs syndrome Mother    Heart disease Father    Restless legs syndrome Sister    Asthma Brother    Stroke Paternal Grandmother     Social History Social History   Tobacco Use   Smoking status: Never   Smokeless tobacco: Never  Vaping Use   Vaping status: Never Used  Substance Use Topics   Alcohol use: No   Drug use: No     Allergies   Patient has no known allergies.   Review of Systems Review of Systems  Constitutional:  Negative for fatigue and fever.  Gastrointestinal:  Negative for abdominal pain.  Genitourinary:  Positive for dysuria and frequency. Negative for flank pain, hematuria and  vaginal discharge.  Musculoskeletal:  Negative for back pain.  Skin:  Negative for rash.  Neurological:  Negative for dizziness and headaches.     Physical Exam Triage Vital Signs ED Triage Vitals  Encounter Vitals Group     BP 04/12/23 1725 115/81     Systolic BP Percentile --      Diastolic BP Percentile --      Pulse Rate 04/12/23 1725 76     Resp 04/12/23 1725 16     Temp 04/12/23 1725 98.3 F (36.8 C)     Temp Source 04/12/23 1725 Oral     SpO2 04/12/23 1725 99 %     Weight 04/12/23 1724 230 lb (104.3 kg)     Height 04/12/23 1724 5\' 2"  (1.575 m)     Head Circumference --      Peak Flow --      Pain Score 04/12/23 1724 6     Pain Loc --      Pain Education --      Exclude from Growth Chart --    No data found.  Updated Vital Signs BP 115/81 (BP Location: Left Arm)   Pulse 76   Temp 98.3 F (36.8 C) (Oral)   Resp 16   Ht 5\' 2"  (1.575 m)   Wt 230 lb (104.3 kg)   LMP 04/03/2023   SpO2 99%   BMI 42.07 kg/m   Visual Acuity Right Eye Distance:   Left Eye Distance:   Bilateral Distance:    Right Eye Near:   Left Eye Near:    Bilateral Near:     Physical Exam Vitals and nursing note reviewed.  Constitutional:      General: She is not in acute distress. Cardiovascular:     Rate and Rhythm: Normal rate and regular rhythm.     Heart sounds: Normal heart sounds.  Pulmonary:     Effort: Pulmonary effort is normal.     Breath sounds: Normal breath sounds.  Abdominal:     Palpations: Abdomen is soft.     Tenderness: There is no abdominal tenderness. There is no right CVA tenderness, left CVA tenderness, guarding or rebound.  Skin:    Findings: No rash.  Neurological:     Mental Status: She is alert.      UC Treatments / Results  Labs (all labs ordered are listed, but only abnormal results are displayed) Labs Reviewed  POCT URINALYSIS DIP (MANUAL ENTRY) - Abnormal; Notable for the following components:      Result  Value   Clarity, UA cloudy (*)     Blood, UA trace-intact (*)    Leukocytes, UA Small (1+) (*)    All other components within normal limits  URINE CULTURE    EKG   Radiology No results found.  Procedures Procedures (including critical care time)  Medications Ordered in UC Medications - No data to display  Initial Impression / Assessment and Plan / UC Course  I have reviewed the triage vital signs and the nursing notes.  Pertinent labs & imaging results that were available during my care of the patient were reviewed by me and considered in my medical decision making (see chart for details).     Empiric tx for UTI, pending culture results. Discussed return/ER precautions.   E/M: 1 acute uncomplicated illness, 2 data, moderate risk due to prescription management  Final Clinical Impressions(s) / UC Diagnoses   Final diagnoses:  Acute cystitis without hematuria     Discharge Instructions      Take antibiotics as prescribed. Keep hydrated. We are sending urine to the lab for culture and will contact you in a few days with results. Follow-up with PCP if persistent symptoms or concerns. Go to the ER if develop fever, back pain, and vomiting.     ED Prescriptions     Medication Sig Dispense Auth. Provider   nitrofurantoin, macrocrystal-monohydrate, (MACROBID) 100 MG capsule Take 1 capsule (100 mg total) by mouth 2 (two) times daily. 10 capsule Vallery Sa, Keyshla Tunison L, PA      PDMP not reviewed this encounter.   Estanislado Pandy, [place] 04/12/23 1816

## 2023-04-12 NOTE — Discharge Instructions (Addendum)
Take antibiotics as prescribed. Keep hydrated. We are sending urine to the lab for culture and will contact you in a few days with results. Follow-up with PCP if persistent symptoms or concerns. Go to the ER if develop fever, back pain, and vomiting.

## 2023-04-14 LAB — URINE CULTURE: Culture: 90000 — AB
